# Patient Record
Sex: Female | Born: 1940
Health system: Southern US, Community
[De-identification: ages and names within clinical notes are randomized; demographics above are authoritative.]

## PROBLEM LIST (undated history)

## (undated) DIAGNOSIS — J189 Pneumonia, unspecified organism: Secondary | ICD-10-CM

## (undated) DIAGNOSIS — I700 Atherosclerosis of aorta: Secondary | ICD-10-CM

## (undated) DIAGNOSIS — Z2989 Encounter for other specified prophylactic measures: Secondary | ICD-10-CM

## (undated) DIAGNOSIS — C50912 Malignant neoplasm of unspecified site of left female breast: Secondary | ICD-10-CM

## (undated) DIAGNOSIS — M1990 Unspecified osteoarthritis, unspecified site: Secondary | ICD-10-CM

## (undated) DIAGNOSIS — I4821 Permanent atrial fibrillation: Secondary | ICD-10-CM

## (undated) DIAGNOSIS — Z298 Encounter for other specified prophylactic measures: Secondary | ICD-10-CM

## (undated) HISTORY — DX: Permanent atrial fibrillation: I48.21

## (undated) HISTORY — PX: APPENDECTOMY: SHX54

## (undated) HISTORY — PX: TUBAL LIGATION: SHX77

## (undated) HISTORY — DX: Atherosclerosis of aorta: I70.0

## (undated) HISTORY — DX: Encounter for other specified prophylactic measures: Z29.8

## (undated) HISTORY — DX: Encounter for other specified prophylactic measures: Z29.89

---

## 1985-10-25 DIAGNOSIS — C50912 Malignant neoplasm of unspecified site of left female breast: Secondary | ICD-10-CM

## 1985-10-25 HISTORY — PX: BREAST BIOPSY: SHX20

## 1985-10-25 HISTORY — PX: MASTECTOMY: SHX3

## 1985-10-25 HISTORY — DX: Malignant neoplasm of unspecified site of left female breast: C50.912

## 1998-05-07 ENCOUNTER — Ambulatory Visit (HOSPITAL_COMMUNITY): Admission: RE | Admit: 1998-05-07 | Discharge: 1998-05-07 | Payer: Self-pay | Admitting: General Surgery

## 2001-04-13 ENCOUNTER — Other Ambulatory Visit: Admission: RE | Admit: 2001-04-13 | Discharge: 2001-04-13 | Payer: Self-pay | Admitting: Obstetrics and Gynecology

## 2002-05-16 ENCOUNTER — Other Ambulatory Visit: Admission: RE | Admit: 2002-05-16 | Discharge: 2002-05-16 | Payer: Self-pay | Admitting: Obstetrics and Gynecology

## 2002-08-20 ENCOUNTER — Ambulatory Visit (HOSPITAL_COMMUNITY): Admission: RE | Admit: 2002-08-20 | Discharge: 2002-08-20 | Payer: Self-pay | Admitting: Gastroenterology

## 2002-08-20 ENCOUNTER — Encounter (INDEPENDENT_AMBULATORY_CARE_PROVIDER_SITE_OTHER): Payer: Self-pay

## 2003-05-09 ENCOUNTER — Encounter: Admission: RE | Admit: 2003-05-09 | Discharge: 2003-06-10 | Payer: Self-pay | Admitting: Family Medicine

## 2003-08-02 ENCOUNTER — Other Ambulatory Visit: Admission: RE | Admit: 2003-08-02 | Discharge: 2003-08-02 | Payer: Self-pay | Admitting: Obstetrics and Gynecology

## 2004-12-07 ENCOUNTER — Other Ambulatory Visit: Admission: RE | Admit: 2004-12-07 | Discharge: 2004-12-07 | Payer: Self-pay | Admitting: Obstetrics and Gynecology

## 2006-02-07 ENCOUNTER — Other Ambulatory Visit: Admission: RE | Admit: 2006-02-07 | Discharge: 2006-02-07 | Payer: Self-pay | Admitting: Obstetrics and Gynecology

## 2007-02-28 ENCOUNTER — Other Ambulatory Visit: Admission: RE | Admit: 2007-02-28 | Discharge: 2007-02-28 | Payer: Self-pay | Admitting: Obstetrics and Gynecology

## 2008-03-01 ENCOUNTER — Other Ambulatory Visit: Admission: RE | Admit: 2008-03-01 | Discharge: 2008-03-01 | Payer: Self-pay | Admitting: Family Medicine

## 2010-06-01 ENCOUNTER — Ambulatory Visit: Payer: Self-pay | Admitting: Cardiology

## 2010-06-18 ENCOUNTER — Ambulatory Visit: Payer: Self-pay | Admitting: Cardiology

## 2010-08-19 ENCOUNTER — Ambulatory Visit: Payer: Self-pay | Admitting: Cardiology

## 2010-09-03 ENCOUNTER — Encounter: Admission: RE | Admit: 2010-09-03 | Discharge: 2010-09-03 | Payer: Self-pay | Admitting: Family Medicine

## 2010-09-21 ENCOUNTER — Ambulatory Visit: Payer: Self-pay | Admitting: Cardiology

## 2010-10-25 HISTORY — PX: US ECHOCARDIOGRAPHY: HXRAD669

## 2010-10-28 ENCOUNTER — Encounter: Payer: Self-pay | Admitting: Cardiology

## 2010-10-29 ENCOUNTER — Other Ambulatory Visit: Payer: Self-pay | Admitting: Cardiology

## 2010-10-29 ENCOUNTER — Ambulatory Visit (HOSPITAL_COMMUNITY)
Admission: RE | Admit: 2010-10-29 | Discharge: 2010-10-29 | Payer: Self-pay | Source: Home / Self Care | Attending: Cardiology | Admitting: Cardiology

## 2010-10-29 ENCOUNTER — Ambulatory Visit: Admission: RE | Admit: 2010-10-29 | Discharge: 2010-10-29 | Payer: Self-pay | Source: Home / Self Care

## 2010-11-06 ENCOUNTER — Ambulatory Visit: Payer: Self-pay | Admitting: Cardiology

## 2011-01-04 ENCOUNTER — Encounter (INDEPENDENT_AMBULATORY_CARE_PROVIDER_SITE_OTHER): Payer: Medicare Other

## 2011-01-04 DIAGNOSIS — Z7901 Long term (current) use of anticoagulants: Secondary | ICD-10-CM

## 2011-01-04 DIAGNOSIS — I4891 Unspecified atrial fibrillation: Secondary | ICD-10-CM

## 2011-02-22 ENCOUNTER — Other Ambulatory Visit: Payer: Self-pay | Admitting: Cardiology

## 2011-02-22 DIAGNOSIS — Z7901 Long term (current) use of anticoagulants: Secondary | ICD-10-CM

## 2011-02-22 DIAGNOSIS — I4891 Unspecified atrial fibrillation: Secondary | ICD-10-CM

## 2011-03-08 ENCOUNTER — Encounter: Payer: Self-pay | Admitting: *Deleted

## 2011-03-09 ENCOUNTER — Ambulatory Visit (INDEPENDENT_AMBULATORY_CARE_PROVIDER_SITE_OTHER): Payer: Medicare Other | Admitting: *Deleted

## 2011-03-09 ENCOUNTER — Ambulatory Visit (INDEPENDENT_AMBULATORY_CARE_PROVIDER_SITE_OTHER): Payer: Medicare Other | Admitting: Cardiology

## 2011-03-09 ENCOUNTER — Encounter: Payer: Self-pay | Admitting: Cardiology

## 2011-03-09 VITALS — BP 126/78 | HR 78 | Ht 63.0 in | Wt 146.0 lb

## 2011-03-09 DIAGNOSIS — Z7901 Long term (current) use of anticoagulants: Secondary | ICD-10-CM | POA: Insufficient documentation

## 2011-03-09 DIAGNOSIS — I4891 Unspecified atrial fibrillation: Secondary | ICD-10-CM

## 2011-03-09 NOTE — Assessment & Plan Note (Signed)
The patient has a history of chronic atrial fibrillation.  She has been on long-term Coumadin without difficulty.  She has not been expressing any TIA or stroke symptoms.  She denies any chest pain or shortness of breath.  Her energy level is good.  Her only complaint today is difficulty with insomnia and she is going to try some over-the-counter melatonin to help that.

## 2011-03-09 NOTE — Progress Notes (Signed)
Kathryn Chang Date of Birth:  Oct 20, 1941 [facility] / [facility] [address]   [address] [place], [place]  [address] [phone]           Fax   [phone]  HPI: This 70 year old woman is seen for a scheduled followup office visit.  She's had a long history of atrial fibrillation.  Her last echocardiogram on 10/29/10 showed normal left ventricular systolic function with an ejection fraction of 55-60% and she had mild aortic insufficiency and mild mitral regurgitation.  She has not had any symptoms of congestive heart failure.  She's not having any chest pain and she's not having any awareness of palpitations.  Current Outpatient Prescriptions  Medication Sig Dispense Refill  . Alendronate Sodium (FOSAMAX PO) Take by mouth once a week.        Marland Kitchen aspirin 81 MG tablet Take 81 mg by mouth daily.        . Calcium Carbonate-Vitamin D (CALCIUM + D PO) Take 400 mg by mouth daily.        . Cholecalciferol (VITAMIN D PO) Take 2,000 Units by mouth daily.        . digoxin (LANOXIN) 0.25 MG tablet Take 250 mcg by mouth daily.        Tery Sanfilippo Calcium (STOOL SOFTENER PO) Take by mouth as needed.        . fish oil-omega-3 fatty acids 1000 MG capsule Take 1 g by mouth daily.        . Multiple Vitamin (MULTIVITAMIN PO) Take by mouth daily.        Marland Kitchen warfarin (COUMADIN) 5 MG tablet Take 5 mg by mouth daily.          Allergies  Allergen Reactions  . Shellfish-Derived Products     Patient Active Problem List  Diagnoses  . Atrial fibrillation  . Encounter for long-term (current) use of anticoagulants    History  Smoking status  . Never Smoker   Smokeless tobacco  . Not on file    History  Alcohol Use: Not on file    Family History  Problem Relation Age of Onset  . Hypertension Father   . Heart disease Father     Review of Systems: The patient denies any heat or cold intolerance.  No weight gain or weight loss.  The patient denies headaches or blurry vision.   There is no cough or sputum production.  The patient denies dizziness.  There is no hematuria or hematochezia.  The patient denies any muscle aches or arthritis.  The patient denies any rash.  The patient denies frequent falling or instability.  There is no history of depression or anxiety.  All other systems were reviewed and are negative.   Physical Exam: Filed Vitals:   03/09/11 1107  BP: 126/78  Pulse: 78  The general appearance reveals a well-developed well-nourished woman in no distress.Pupils equal and reactive.   Extraocular Movements are full.  There is no scleral icterus.  The mouth and pharynx are normal.  The neck is supple.  The carotids reveal no bruits.  The jugular venous pressure is normal.  The thyroid is not enlarged.  There is no lymphadenopathy.The chest is clear to percussion and auscultation. There are no rales or rhonchi. Expansion of the chest is symmetrical.The precordium is quiet.  The first heart sound is normal.  The second heart sound is physiologically split.  There is no murmur gallop rub or click.  There is no abnormal  lift or heave. The rhythm is irregular.The abdomen is soft and nontender. Bowel sounds are normal. The liver and spleen are not enlarged. There Are no abdominal masses. There are no bruits.  All extremities without phlebitis or edema.  Pedal pulses are good.Strength is normal and symmetrical in all extremities.  There is no lateralizing weakness.  There are no sensory deficits.The skin is warm and dry.  There is no rash.    Assessment / Plan: Her INR is satisfactory at 3.1.  She will return at monthly intervals for protime to we will see her in 4 months for followup office visit and EKG.

## 2011-03-12 NOTE — Op Note (Signed)
   NAME:  Kathryn Chang, Kathryn Chang                  ACCOUNT NO.:  [id number]   MEDICAL RECORD NO.:  [id number]                   PATIENT TYPE:  AMB   LOCATION:  ENDO                                 FACILITY:  [facility]   PHYSICIAN:  James Chang. Malon Kindle., M.D.          DATE OF BIRTH:  Dec 15, 1940   DATE OF PROCEDURE:  08/20/2002  DATE OF DISCHARGE:                                 OPERATIVE REPORT   PROCEDURE:  Colonoscopy and polypectomy.   MEDICATIONS:  Fentanyl 100 mcg, Versed 8 mg IV.   ENDOSCOPE:  Olympus pediatric video colonoscope.   INDICATIONS:  Strong family history of colon neoplasia.   DESCRIPTION OF PROCEDURE:  The procedure had been explained to the patient  and consent obtained.  With the patient in the left lateral decubitus  position, the scope was inserted and advanced.  We advanced to the cecum.  The ileocecal valve and appendiceal orifice were seen.  The scope was  withdrawn, and the cecum, ascending colon, hepatic flexure, transverse  colon, descending colon seen well.  At 50 cm from the anal verge, a 1 cm  polyp was encountered and was removed in two pieces and sucked through the  scope.  There was no bleeding.  The remainder of the descending colon and  sigmoid colon were free of polyps.  The scope was withdrawn.  The patient  tolerated the procedure well.  The rectum was free of polyps as well.   ASSESSMENT:  Descending colon polyp, snared and removed.   PLAN:  Routine postpolypectomy instructions.  Will recommend repeating in  three years.                                                 James Chang. Malon Kindle., M.D.    Waldron Session  D:  08/20/2002  T:  08/21/2002  Job:  [id number]   cc:   Caryn Bee Chang. Little, M.D.  [address]  [place]  [place] [address]  Fax: [phone]

## 2011-04-09 ENCOUNTER — Ambulatory Visit (INDEPENDENT_AMBULATORY_CARE_PROVIDER_SITE_OTHER): Payer: Medicare Other | Admitting: *Deleted

## 2011-04-09 DIAGNOSIS — I4891 Unspecified atrial fibrillation: Secondary | ICD-10-CM

## 2011-05-21 ENCOUNTER — Ambulatory Visit (INDEPENDENT_AMBULATORY_CARE_PROVIDER_SITE_OTHER): Payer: Medicare Other | Admitting: *Deleted

## 2011-05-21 DIAGNOSIS — I4891 Unspecified atrial fibrillation: Secondary | ICD-10-CM

## 2011-05-21 LAB — POCT INR: INR: 2.6

## 2011-07-13 ENCOUNTER — Telehealth: Payer: Self-pay | Admitting: Cardiology

## 2011-07-13 ENCOUNTER — Encounter (INDEPENDENT_AMBULATORY_CARE_PROVIDER_SITE_OTHER): Payer: Medicare Other | Admitting: *Deleted

## 2011-07-13 ENCOUNTER — Ambulatory Visit (INDEPENDENT_AMBULATORY_CARE_PROVIDER_SITE_OTHER): Payer: Medicare Other | Admitting: Cardiology

## 2011-07-13 VITALS — BP 132/72 | HR 74 | Wt 146.0 lb

## 2011-07-13 DIAGNOSIS — I4891 Unspecified atrial fibrillation: Secondary | ICD-10-CM

## 2011-07-13 NOTE — Assessment & Plan Note (Signed)
The patient is in chronic atrial fibrillation.  She has had no thromboembolic events.  She's not having any chest pain or shortness of breath or palpitations.  Her energy level is good.

## 2011-07-13 NOTE — Progress Notes (Signed)
Kathryn Chang Date of Birth:  19-Dec-1940 [name] / [facility] [address]   [address] [place], [place]  [address] [phone]           Fax   [phone]  HPI: This pleasant 70 year old woman is seen for a scheduled followup office visit.  She has a history of chronic atrial fibrillation.  Her last echocardiogram on 10/29/10 showed normal left ventricular systolic function with an ejection fraction of 55-60% with mild aortic insufficiency and mild mitral regurgitation.  The patient has not been expressing any new cardiac symptoms.  Current Outpatient Prescriptions  Medication Sig Dispense Refill  . Alendronate Sodium (FOSAMAX PO) Take by mouth once a week.        Marland Kitchen aspirin 81 MG tablet Take 81 mg by mouth daily.        . Calcium Carbonate-Vitamin D (CALCIUM + D PO) Take 400 mg by mouth daily.        . Cholecalciferol (VITAMIN D PO) Take 2,000 Units by mouth daily.        . digoxin (LANOXIN) 0.25 MG tablet Take 250 mcg by mouth daily.        Tery Sanfilippo Calcium (STOOL SOFTENER PO) Take by mouth as needed.        . fish oil-omega-3 fatty acids 1000 MG capsule Take 1 g by mouth daily.        . Multiple Vitamin (MULTIVITAMIN PO) Take by mouth daily.        Marland Kitchen warfarin (COUMADIN) 5 MG tablet Take 5 mg by mouth daily.          Allergies  Allergen Reactions  . Shellfish-Derived Products     Patient Active Problem List  Diagnoses  . Atrial fibrillation  . Encounter for long-term (current) use of anticoagulants    History  Smoking status  . Never Smoker   Smokeless tobacco  . Not on file    History  Alcohol Use: Not on file    Family History  Problem Relation Age of Onset  . Hypertension Father   . Heart disease Father     Review of Systems: The patient denies any heat or cold intolerance.  No weight gain or weight loss.  The patient denies headaches or blurry vision.  There is no cough or sputum production.  The patient denies dizziness.   There is no hematuria or hematochezia.  The patient denies any muscle aches or arthritis.  The patient denies any rash.  The patient denies frequent falling or instability.  There is no history of depression or anxiety.  All other systems were reviewed and are negative.   Physical Exam: Filed Vitals:   07/13/11 1754  BP: 132/72  Pulse: 74   The general appearance reveals a well-developed well-nourished woman in no distress.Pupils equal and reactive.   Extraocular Movements are full.  There is no scleral icterus.  The mouth and pharynx are normal.  The neck is supple.  The carotids reveal no bruits.  The jugular venous pressure is normal.  The thyroid is not enlarged.  There is no lymphadenopathy.  The chest is clear to percussion and auscultation. There are no rales or rhonchi. Expansion of the chest is symmetrical.    The heart reveals a soft systolic murmur at the base.  No diastolic murmur.  No gallop or rub. The abdomen is soft and nontender. Bowel sounds are normal. The liver and spleen are not enlarged. There Are no abdominal masses. There  are no bruits.  The pedal pulses are good.  There is no phlebitis or edema.  There is no cyanosis or clubbing.  Strength is normal and symmetrical in all extremities.  There is no lateralizing weakness.  There are no sensory deficits.  The skin is warm and dry.  There is no rash.    Assessment / Plan: Her electrocardiogram today shows atrial fibrillation with a controlled ventricular response.  He will continue same medication and return in one month for protime to the Coumadin clinic.  Recheck in 4 months for a followup office visit

## 2011-07-14 ENCOUNTER — Encounter: Payer: Self-pay | Admitting: Cardiology

## 2011-08-16 ENCOUNTER — Encounter: Payer: Medicare Other | Admitting: *Deleted

## 2011-08-18 ENCOUNTER — Ambulatory Visit (INDEPENDENT_AMBULATORY_CARE_PROVIDER_SITE_OTHER): Payer: Medicare Other | Admitting: *Deleted

## 2011-08-18 DIAGNOSIS — I4891 Unspecified atrial fibrillation: Secondary | ICD-10-CM

## 2011-08-18 DIAGNOSIS — Z7901 Long term (current) use of anticoagulants: Secondary | ICD-10-CM

## 2011-08-18 LAB — POCT INR: INR: 2.2

## 2011-09-14 ENCOUNTER — Ambulatory Visit (INDEPENDENT_AMBULATORY_CARE_PROVIDER_SITE_OTHER): Payer: Medicare Other | Admitting: *Deleted

## 2011-09-14 DIAGNOSIS — Z7901 Long term (current) use of anticoagulants: Secondary | ICD-10-CM

## 2011-09-14 DIAGNOSIS — I4891 Unspecified atrial fibrillation: Secondary | ICD-10-CM

## 2011-09-14 LAB — POCT INR: INR: 3.5

## 2011-09-15 ENCOUNTER — Encounter: Payer: Medicare Other | Admitting: *Deleted

## 2011-09-29 ENCOUNTER — Ambulatory Visit (INDEPENDENT_AMBULATORY_CARE_PROVIDER_SITE_OTHER): Payer: Medicare Other | Admitting: *Deleted

## 2011-09-29 DIAGNOSIS — Z7901 Long term (current) use of anticoagulants: Secondary | ICD-10-CM

## 2011-09-29 DIAGNOSIS — I4891 Unspecified atrial fibrillation: Secondary | ICD-10-CM

## 2011-10-08 ENCOUNTER — Other Ambulatory Visit: Payer: Self-pay | Admitting: Cardiology

## 2011-10-27 ENCOUNTER — Ambulatory Visit (INDEPENDENT_AMBULATORY_CARE_PROVIDER_SITE_OTHER): Payer: Medicare Other | Admitting: *Deleted

## 2011-10-27 DIAGNOSIS — Z7901 Long term (current) use of anticoagulants: Secondary | ICD-10-CM

## 2011-10-27 DIAGNOSIS — I4891 Unspecified atrial fibrillation: Secondary | ICD-10-CM

## 2011-11-24 ENCOUNTER — Encounter: Payer: Medicare Other | Admitting: *Deleted

## 2011-11-24 ENCOUNTER — Ambulatory Visit: Payer: Medicare Other | Admitting: Cardiology

## 2011-11-24 ENCOUNTER — Ambulatory Visit: Payer: Medicare Other | Admitting: Nurse Practitioner

## 2011-12-01 ENCOUNTER — Ambulatory Visit (INDEPENDENT_AMBULATORY_CARE_PROVIDER_SITE_OTHER): Payer: Medicare Other | Admitting: *Deleted

## 2011-12-01 DIAGNOSIS — I4891 Unspecified atrial fibrillation: Secondary | ICD-10-CM

## 2011-12-01 DIAGNOSIS — Z7901 Long term (current) use of anticoagulants: Secondary | ICD-10-CM

## 2011-12-01 LAB — POCT INR: INR: 2.9

## 2011-12-15 ENCOUNTER — Encounter: Payer: Self-pay | Admitting: Cardiology

## 2011-12-15 ENCOUNTER — Ambulatory Visit (INDEPENDENT_AMBULATORY_CARE_PROVIDER_SITE_OTHER): Payer: Medicare Other | Admitting: Cardiology

## 2011-12-15 VITALS — BP 124/76 | HR 98 | Ht 63.0 in | Wt 158.0 lb

## 2011-12-15 DIAGNOSIS — I4891 Unspecified atrial fibrillation: Secondary | ICD-10-CM

## 2011-12-15 NOTE — Assessment & Plan Note (Signed)
The patient has had no symptoms referable to her atrial fibrillation.  No TIA symptoms.  No problems with her Coumadin.

## 2011-12-15 NOTE — Patient Instructions (Signed)
Your physician recommends that you continue on your current medications as directed. Please refer to the Current Medication list given to you today. Your physician recommends that you schedule a follow-up appointment in: 4 month 

## 2011-12-15 NOTE — Progress Notes (Signed)
Randon Goldsmith Hausmann Date of Birth:  1941/03/09 [facility] [address] [place], [place]  [address] [phone]  Fax   [phone]  HPI: This pleasant 71 year old woman is seen for a four-month followup office visit.  She has a history of established chronic atrial fibrillation.  He is on long-term Coumadin.  She has not been experiencing any chest pain or shortness of breath since last visit her weight has gone up 12 pounds.  She was under a lot of stress because her mother who lived in [place] had open-heart surgery and subsequently died after a month in the ICU.  Current Outpatient Prescriptions  Medication Sig Dispense Refill  . Alendronate Sodium (FOSAMAX PO) Take by mouth once a week.        Marland Kitchen aspirin 81 MG tablet Take 81 mg by mouth daily.        . Calcium Carbonate-Vitamin D (CALCIUM + D PO) Take 400 mg by mouth daily.        . Cholecalciferol (VITAMIN D PO) Take 2,000 Units by mouth daily.        Tery Sanfilippo Calcium (STOOL SOFTENER PO) Take by mouth as needed.        . fish oil-omega-3 fatty acids 1000 MG capsule Take 1 g by mouth daily.        Marland Kitchen LANOXIN 0.25 MG tablet Take 1 tablet (250 mcg total) by mouth daily.  90 tablet  2  . Multiple Vitamin (MULTIVITAMIN PO) Take by mouth daily.        Marland Kitchen warfarin (COUMADIN) 5 MG tablet Take 5 mg by mouth daily.          Allergies  Allergen Reactions  . Shellfish-Derived Products     Patient Active Problem List  Diagnoses  . Atrial fibrillation  . Encounter for long-term (current) use of anticoagulants    History  Smoking status  . Never Smoker   Smokeless tobacco  . Not on file    History  Alcohol Use: Not on file    Family History  Problem Relation Age of Onset  . Hypertension Father   . Heart disease Father     Review of Systems: The patient denies any heat or cold intolerance.  No weight gain or weight loss.  The patient denies headaches or blurry vision.  There is no cough or sputum  production.  The patient denies dizziness.  There is no hematuria or hematochezia.  The patient denies any muscle aches or arthritis.  The patient denies any rash.  The patient denies frequent falling or instability.  There is no history of depression or anxiety.  All other systems were reviewed and are negative.   Physical Exam: Filed Vitals:   12/15/11 1514  BP: 124/76  Pulse: 98   the general appearance reveals a well-developed well-nourished woman in no distress.Pupils equal and reactive.   Extraocular Movements are full.  There is no scleral icterus.  The mouth and pharynx are normal.  The neck is supple.  The carotids reveal no bruits.  The jugular venous pressure is normal.  The thyroid is not enlarged.  There is no lymphadenopathy.  The chest is clear to percussion and auscultation. There are no rales or rhonchi. Expansion of the chest is symmetrical.  Heart reveals an irregular rhythm.  No murmur gallop rub or click.The abdomen is soft and nontender. Bowel sounds are normal. The liver and spleen are not enlarged. There Are no abdominal masses. There are no bruits.  The pedal pulses are good.  There is no phlebitis or edema.  There is no cyanosis or clubbing. Strength is normal and symmetrical in all extremities.  There is no lateralizing weakness.  There are no sensory deficits.  The skin is warm and dry.  There is no rash.     Assessment / Plan: The patient is to continue same medication.  She will work hard to get the extra weight off before next visit.

## 2012-01-12 ENCOUNTER — Ambulatory Visit (INDEPENDENT_AMBULATORY_CARE_PROVIDER_SITE_OTHER): Payer: Medicare Other | Admitting: *Deleted

## 2012-01-12 DIAGNOSIS — I4891 Unspecified atrial fibrillation: Secondary | ICD-10-CM

## 2012-01-12 DIAGNOSIS — Z7901 Long term (current) use of anticoagulants: Secondary | ICD-10-CM

## 2012-01-12 LAB — POCT INR: INR: 3.6

## 2012-02-02 ENCOUNTER — Ambulatory Visit (INDEPENDENT_AMBULATORY_CARE_PROVIDER_SITE_OTHER): Payer: Medicare Other | Admitting: *Deleted

## 2012-02-02 DIAGNOSIS — I4891 Unspecified atrial fibrillation: Secondary | ICD-10-CM

## 2012-02-02 DIAGNOSIS — Z7901 Long term (current) use of anticoagulants: Secondary | ICD-10-CM

## 2012-02-08 NOTE — Telephone Encounter (Signed)
ENCOUNTER COMPLETED 

## 2012-02-09 ENCOUNTER — Telehealth: Payer: Self-pay | Admitting: Cardiology

## 2012-03-06 ENCOUNTER — Ambulatory Visit (INDEPENDENT_AMBULATORY_CARE_PROVIDER_SITE_OTHER): Payer: Medicare Other | Admitting: *Deleted

## 2012-03-06 DIAGNOSIS — Z7901 Long term (current) use of anticoagulants: Secondary | ICD-10-CM

## 2012-03-06 DIAGNOSIS — I4891 Unspecified atrial fibrillation: Secondary | ICD-10-CM

## 2012-03-06 MED ORDER — WARFARIN SODIUM 5 MG PO TABS: 5.0000 mg | ORAL_TABLET | Freq: Every day | ORAL | Status: DC

## 2012-03-16 ENCOUNTER — Telehealth: Payer: Self-pay | Admitting: Cardiology

## 2012-03-16 NOTE — Telephone Encounter (Signed)
New msg Pt is having colonoscopy and they want to know can he hold Plavix. Please fax ok to [phone]

## 2012-03-16 NOTE — Telephone Encounter (Signed)
Marchelle Folks from Dr. Randa Evens' office wold like to know if patient can hold Plavix medication prior Colonoscopy. Patient is scheduled for this procedure June 4 th 2013. Please fax clearance to [phone].

## 2012-03-20 NOTE — Telephone Encounter (Signed)
She is on warfarin, not plavix.  OK to stop warfarin 5 days before colonoscopy. Does not require bridging.

## 2012-03-21 NOTE — Telephone Encounter (Signed)
Advised patient and faxed to [facility]

## 2012-04-03 ENCOUNTER — Ambulatory Visit (INDEPENDENT_AMBULATORY_CARE_PROVIDER_SITE_OTHER): Payer: Medicare Other | Admitting: *Deleted

## 2012-04-03 DIAGNOSIS — Z7901 Long term (current) use of anticoagulants: Secondary | ICD-10-CM

## 2012-04-03 DIAGNOSIS — I4891 Unspecified atrial fibrillation: Secondary | ICD-10-CM

## 2012-04-03 LAB — POCT INR: INR: 1.5

## 2012-04-11 ENCOUNTER — Encounter: Payer: Self-pay | Admitting: Cardiology

## 2012-04-11 ENCOUNTER — Ambulatory Visit (INDEPENDENT_AMBULATORY_CARE_PROVIDER_SITE_OTHER): Payer: Medicare Other | Admitting: Cardiology

## 2012-04-11 ENCOUNTER — Ambulatory Visit (INDEPENDENT_AMBULATORY_CARE_PROVIDER_SITE_OTHER): Payer: Medicare Other | Admitting: Pharmacist

## 2012-04-11 VITALS — BP 126/81 | HR 68 | Ht 63.0 in | Wt 155.8 lb

## 2012-04-11 DIAGNOSIS — I4891 Unspecified atrial fibrillation: Secondary | ICD-10-CM

## 2012-04-11 DIAGNOSIS — Z7901 Long term (current) use of anticoagulants: Secondary | ICD-10-CM

## 2012-04-11 NOTE — Progress Notes (Signed)
Kathryn Chang Date of Birth:  04-16-1941 [facility] [address] [place], [place]  [address] [phone]  Fax   [phone]  HPI: This pleasant 71 year old woman is seen for a scheduled followup office visit.  She has a history of established atrial fibrillation.  She is on long-term Coumadin.  He has been feeling well since last visit.  Several months ago she had bronchitis which lasted about a month.  She had been on 2 rounds of antibiotics before it got better.  Recently she had an uneventful colonoscopy.  She does not need another one for 5 years.  The patient is attempting to get more regular exercise.  She has joined [employer] and is walking and doing yoga.  In August she will be traveling to [place] on a mission trip with her church.  Current Outpatient Prescriptions  Medication Sig Dispense Refill  . Alendronate Sodium (FOSAMAX PO) Take by mouth once a week.        Marland Kitchen aspirin 81 MG tablet Take 81 mg by mouth daily.        . Calcium Carbonate-Vitamin D (CALCIUM + D PO) Take 400 mg by mouth daily.        . Cholecalciferol (VITAMIN D PO) Take 2,000 Units by mouth daily.        Tery Sanfilippo Calcium (STOOL SOFTENER PO) Take by mouth as needed.        . fish oil-omega-3 fatty acids 1000 MG capsule Take 1 g by mouth daily.        Marland Kitchen LANOXIN 0.25 MG tablet Take 1 tablet (250 mcg total) by mouth daily.  90 tablet  2  . Multiple Vitamin (MULTIVITAMIN PO) Take by mouth daily.        Marland Kitchen warfarin (COUMADIN) 5 MG tablet Take 1 tablet (5 mg total) by mouth daily. This is a 90 day supply, take as directed by coumadin clinic  105 tablet  1    Allergies  Allergen Reactions  . Shellfish-Derived Products     Patient Active Problem List  Diagnosis  . Atrial fibrillation  . Encounter for long-term (current) use of anticoagulants    History  Smoking status  . Never Smoker   Smokeless tobacco  . Not on file    History  Alcohol Use: Not on file    Family  History  Problem Relation Age of Onset  . Hypertension Father   . Heart disease Father     Review of Systems: The patient denies any heat or cold intolerance.  No weight gain or weight loss.  The patient denies headaches or blurry vision.  There is no cough or sputum production.  The patient denies dizziness.  There is no hematuria or hematochezia.  The patient denies any muscle aches or arthritis.  The patient denies any rash.  The patient denies frequent falling or instability.  There is no history of depression or anxiety.  All other systems were reviewed and are negative.   Physical Exam: Filed Vitals:   04/11/12 0913  BP: 126/81  Pulse: 68   general appearance reveals a well-developed well-nourished middle-age woman in no distress.The head and neck exam reveals pupils equal and reactive.  Extraocular movements are full.  There is no scleral icterus.  The mouth and pharynx are normal.  The neck is supple.  The carotids reveal no bruits.  The jugular venous pressure is normal.  The  thyroid is not enlarged.  There is no lymphadenopathy.  The  chest is clear to percussion and auscultation.  There are no rales or rhonchi.  Expansion of the chest is symmetrical.  The precordium is quiet.  The rhythm is irregular  The first heart sound is normal.  The second heart sound is physiologically split.  There is no murmur gallop rub or click.  There is no abnormal lift or heave.  The abdomen is soft and nontender.  The bowel sounds are normal.  The liver and spleen are not enlarged.  There are no abdominal masses.  There are no abdominal bruits.  Extremities reveal good pedal pulses.  There is no phlebitis or edema.  There is no cyanosis or clubbing.  Strength is normal and symmetrical in all extremities.  There is no lateralizing weakness.  There are no sensory deficits.  The skin is warm and dry.  There is no rash.      Assessment / Plan:  Continue same medication.  Recheck in 4 months for followup  office visit.

## 2012-04-11 NOTE — Assessment & Plan Note (Signed)
The patient is not having any problems of bruising or bleeding from her Coumadin anticoagulation

## 2012-04-11 NOTE — Assessment & Plan Note (Signed)
The patient has not been experiencing any TIA symptoms.  No chest pain or shortness of breath.

## 2012-04-11 NOTE — Patient Instructions (Addendum)
Your physician recommends that you schedule a follow-up appointment in: 4 months with Dr. Patty Sermons.

## 2012-05-02 ENCOUNTER — Ambulatory Visit (INDEPENDENT_AMBULATORY_CARE_PROVIDER_SITE_OTHER): Payer: Medicare Other | Admitting: Pharmacist

## 2012-05-02 DIAGNOSIS — I4891 Unspecified atrial fibrillation: Secondary | ICD-10-CM

## 2012-05-02 DIAGNOSIS — Z7901 Long term (current) use of anticoagulants: Secondary | ICD-10-CM

## 2012-05-02 LAB — POCT INR: INR: 5.5

## 2012-05-10 ENCOUNTER — Ambulatory Visit (INDEPENDENT_AMBULATORY_CARE_PROVIDER_SITE_OTHER): Payer: Medicare Other | Admitting: *Deleted

## 2012-05-10 DIAGNOSIS — I4891 Unspecified atrial fibrillation: Secondary | ICD-10-CM

## 2012-05-10 DIAGNOSIS — Z7901 Long term (current) use of anticoagulants: Secondary | ICD-10-CM

## 2012-05-30 ENCOUNTER — Ambulatory Visit (INDEPENDENT_AMBULATORY_CARE_PROVIDER_SITE_OTHER): Payer: Medicare Other

## 2012-05-30 DIAGNOSIS — I4891 Unspecified atrial fibrillation: Secondary | ICD-10-CM

## 2012-05-30 DIAGNOSIS — Z7901 Long term (current) use of anticoagulants: Secondary | ICD-10-CM

## 2012-06-12 ENCOUNTER — Ambulatory Visit (INDEPENDENT_AMBULATORY_CARE_PROVIDER_SITE_OTHER): Payer: Medicare Other | Admitting: *Deleted

## 2012-06-12 DIAGNOSIS — I4891 Unspecified atrial fibrillation: Secondary | ICD-10-CM

## 2012-06-12 DIAGNOSIS — Z7901 Long term (current) use of anticoagulants: Secondary | ICD-10-CM

## 2012-07-03 ENCOUNTER — Ambulatory Visit (INDEPENDENT_AMBULATORY_CARE_PROVIDER_SITE_OTHER): Payer: Medicare Other

## 2012-07-03 DIAGNOSIS — I4891 Unspecified atrial fibrillation: Secondary | ICD-10-CM

## 2012-07-03 DIAGNOSIS — Z7901 Long term (current) use of anticoagulants: Secondary | ICD-10-CM

## 2012-07-03 LAB — POCT INR: INR: 3.3

## 2012-07-26 ENCOUNTER — Ambulatory Visit (INDEPENDENT_AMBULATORY_CARE_PROVIDER_SITE_OTHER): Payer: Medicare Other | Admitting: Pharmacist

## 2012-07-26 DIAGNOSIS — I4891 Unspecified atrial fibrillation: Secondary | ICD-10-CM

## 2012-07-26 DIAGNOSIS — Z7901 Long term (current) use of anticoagulants: Secondary | ICD-10-CM

## 2012-07-26 LAB — POCT INR: INR: 2.3

## 2012-08-22 ENCOUNTER — Ambulatory Visit (INDEPENDENT_AMBULATORY_CARE_PROVIDER_SITE_OTHER): Payer: Medicare Other | Admitting: Cardiology

## 2012-08-22 ENCOUNTER — Encounter: Payer: Self-pay | Admitting: Cardiology

## 2012-08-22 ENCOUNTER — Ambulatory Visit (INDEPENDENT_AMBULATORY_CARE_PROVIDER_SITE_OTHER): Payer: Medicare Other | Admitting: *Deleted

## 2012-08-22 VITALS — BP 130/70 | HR 72 | Ht 63.0 in | Wt 155.8 lb

## 2012-08-22 DIAGNOSIS — I4891 Unspecified atrial fibrillation: Secondary | ICD-10-CM

## 2012-08-22 DIAGNOSIS — Z7901 Long term (current) use of anticoagulants: Secondary | ICD-10-CM

## 2012-08-22 NOTE — Assessment & Plan Note (Signed)
The patient remains in chronic atrial fibrillation.  She has not been experiencing any chest pain or shortness of breath.  She participates in exercise classes and in yoga.  She is disappointed that she has not been able to lose any weight.  She intends to go on a low carbohydrate diet.

## 2012-08-22 NOTE — Patient Instructions (Addendum)
Your physician recommends that you schedule a follow-up appointment in: 4 months.  Your physician recommends that you continue on your current medications as directed. Please refer to the Current Medication list given to you today.  

## 2012-08-22 NOTE — Progress Notes (Signed)
Kathryn Chang Date of Birth:  21-Jan-1941 [facility] [address] [place], [place]  [address] [phone]  Fax   [phone]  HPI: This pleasant 71 year old woman is seen for a scheduled followup office visit. She has a history of established atrial fibrillation. She is on long-term Coumadin.  She has been feeling well since last visit.  Since we last saw her she went on a mission trip to [place].  She stayed healthy during the trip and had no difficulty.   Current Outpatient Prescriptions  Medication Sig Dispense Refill  . Alendronate Sodium (FOSAMAX PO) Take by mouth once a week.        Marland Kitchen aspirin 81 MG tablet Take 81 mg by mouth daily.        . Calcium Carbonate-Vitamin D (CALCIUM + D PO) Take 400 mg by mouth daily.        . Cholecalciferol (VITAMIN D PO) Take 2,000 Units by mouth daily.        Tery Sanfilippo Calcium (STOOL SOFTENER PO) Take by mouth as needed.        . fish oil-omega-3 fatty acids 1000 MG capsule Take 1 g by mouth daily.        Marland Kitchen LANOXIN 0.25 MG tablet Take 1 tablet (250 mcg total) by mouth daily.  90 tablet  2  . Multiple Vitamin (MULTIVITAMIN PO) Take by mouth daily.        Marland Kitchen warfarin (COUMADIN) 5 MG tablet Take 1 tablet (5 mg total) by mouth daily. This is a 90 day supply, take as directed by coumadin clinic  105 tablet  1    Allergies  Allergen Reactions  . Shellfish-Derived Products     Patient Active Problem List  Diagnosis  . Atrial fibrillation  . Encounter for long-term (current) use of anticoagulants    History  Smoking status  . Never Smoker   Smokeless tobacco  . Not on file    History  Alcohol Use: Not on file    Family History  Problem Relation Age of Onset  . Hypertension Father   . Heart disease Father     Review of Systems: The patient denies any heat or cold intolerance.  No weight gain or weight loss.  The patient denies headaches or blurry vision.  There is no cough or sputum production.  The  patient denies dizziness.  There is no hematuria or hematochezia.  The patient denies any muscle aches or arthritis.  The patient denies any rash.  The patient denies frequent falling or instability.  There is no history of depression or anxiety.  All other systems were reviewed and are negative.   Physical Exam: Filed Vitals:   08/22/12 1407  BP: 130/70  Pulse: 72   on physical examination this is a well-developed well-nourished woman in no distress.The head and neck exam reveals pupils equal and reactive.  Extraocular movements are full.  There is no scleral icterus.  The mouth and pharynx are normal.  The neck is supple.  The carotids reveal no bruits.  The jugular venous pressure is normal.  The  thyroid is not enlarged.  There is no lymphadenopathy.  The chest is clear to percussion and auscultation.  There are no rales or rhonchi.  Expansion of the chest is symmetrical.  The precordium is quiet.  The pulse is irregular in atrial fibrillation.  The first heart sound is normal.  The second heart sound is physiologically split.  There is no murmur  gallop rub or click.  There is no abnormal lift or heave.  The abdomen is soft and nontender.  The bowel sounds are normal.  The liver and spleen are not enlarged.  There are no abdominal masses.  There are no abdominal bruits.  Extremities reveal good pedal pulses.  There is no phlebitis or edema.  There is no cyanosis or clubbing.  Strength is normal and symmetrical in all extremities.  There is no lateralizing weakness.  There are no sensory deficits.  The skin is warm and dry.  There is no rash.   EKG today shows atrial fibrillation with nonspecific T-wave flattening and no ischemic changes.  The ventricular rate is stable at 72  Assessment / Plan: The patient is doing well.  She will continue on current medication.  Recheck in 4 months for followup office visit.  Continue Coumadin as per Coumadin clinic.

## 2012-09-11 ENCOUNTER — Telehealth: Payer: Self-pay | Admitting: Cardiology

## 2012-09-11 NOTE — Telephone Encounter (Signed)
plz return call to pt [phone] to discuss possible medication alternatives for Lanoxin 2.5 mg. This medication has become very expensive for pt.

## 2012-09-11 NOTE — Telephone Encounter (Signed)
Patient is going to change to generic Lanoxin.  She just got 90 days from mail order. She will call back when she need refilled and will send over generic

## 2012-09-11 NOTE — Telephone Encounter (Signed)
No answer, will try again later.

## 2012-09-13 ENCOUNTER — Ambulatory Visit (INDEPENDENT_AMBULATORY_CARE_PROVIDER_SITE_OTHER): Payer: Medicare Other | Admitting: *Deleted

## 2012-09-13 DIAGNOSIS — I4891 Unspecified atrial fibrillation: Secondary | ICD-10-CM

## 2012-09-13 DIAGNOSIS — Z7901 Long term (current) use of anticoagulants: Secondary | ICD-10-CM

## 2012-09-13 LAB — POCT INR: INR: 2.4

## 2012-10-11 ENCOUNTER — Ambulatory Visit (INDEPENDENT_AMBULATORY_CARE_PROVIDER_SITE_OTHER): Payer: Medicare Other | Admitting: *Deleted

## 2012-10-11 DIAGNOSIS — Z7901 Long term (current) use of anticoagulants: Secondary | ICD-10-CM

## 2012-10-11 DIAGNOSIS — I4891 Unspecified atrial fibrillation: Secondary | ICD-10-CM

## 2012-10-11 LAB — POCT INR: INR: 1.4

## 2012-10-23 ENCOUNTER — Ambulatory Visit (INDEPENDENT_AMBULATORY_CARE_PROVIDER_SITE_OTHER): Payer: Medicare Other | Admitting: *Deleted

## 2012-10-23 DIAGNOSIS — Z7901 Long term (current) use of anticoagulants: Secondary | ICD-10-CM

## 2012-10-23 DIAGNOSIS — I4891 Unspecified atrial fibrillation: Secondary | ICD-10-CM

## 2012-11-03 ENCOUNTER — Ambulatory Visit (INDEPENDENT_AMBULATORY_CARE_PROVIDER_SITE_OTHER): Payer: Medicare Other | Admitting: *Deleted

## 2012-11-03 DIAGNOSIS — Z7901 Long term (current) use of anticoagulants: Secondary | ICD-10-CM

## 2012-11-03 DIAGNOSIS — I4891 Unspecified atrial fibrillation: Secondary | ICD-10-CM

## 2012-11-24 ENCOUNTER — Ambulatory Visit (INDEPENDENT_AMBULATORY_CARE_PROVIDER_SITE_OTHER): Payer: Medicare Other

## 2012-11-24 DIAGNOSIS — Z7901 Long term (current) use of anticoagulants: Secondary | ICD-10-CM

## 2012-11-24 DIAGNOSIS — I4891 Unspecified atrial fibrillation: Secondary | ICD-10-CM

## 2012-11-24 LAB — POCT INR: INR: 1.9

## 2012-12-19 ENCOUNTER — Ambulatory Visit (INDEPENDENT_AMBULATORY_CARE_PROVIDER_SITE_OTHER): Payer: Medicare Other | Admitting: *Deleted

## 2012-12-19 DIAGNOSIS — I4891 Unspecified atrial fibrillation: Secondary | ICD-10-CM

## 2012-12-19 DIAGNOSIS — Z7901 Long term (current) use of anticoagulants: Secondary | ICD-10-CM

## 2012-12-20 ENCOUNTER — Telehealth: Payer: Self-pay | Admitting: Cardiology

## 2012-12-20 MED ORDER — DIGOXIN 250 MCG PO TABS: 250.0000 ug | ORAL_TABLET | Freq: Every day | ORAL | Status: DC

## 2012-12-20 MED ORDER — WARFARIN SODIUM 5 MG PO TABS: 5.0000 mg | ORAL_TABLET | Freq: Every day | ORAL | Status: DC

## 2012-12-20 NOTE — Telephone Encounter (Signed)
New Prob    Requesting generic for lanoxin.

## 2012-12-20 NOTE — Telephone Encounter (Signed)
It looks like she has been on 0.25 mg daily. Stay on the same dose except now it is generic.

## 2012-12-20 NOTE — Telephone Encounter (Signed)
Patient called wanting to change to generic Lanoxin 0.25 mg daily and needs refill on Warfarin.  Will forward to  Dr. Patty Sermons to make sure ok to change.

## 2013-01-01 ENCOUNTER — Telehealth: Payer: Self-pay | Admitting: *Deleted

## 2013-01-01 NOTE — Telephone Encounter (Signed)
Patient phoned stating that they sent her name brand Lanoxin instead of generic. Spoke with Mardelle Matte pharmacist at [facility] and they did receive Rx ok to give generic. He advised for patient to call back and they would get it worked out with sending her generic next time and even this time if she wanted to send back Rx. Advised patient

## 2013-01-03 ENCOUNTER — Ambulatory Visit: Payer: Medicare Other | Admitting: Cardiology

## 2013-01-08 ENCOUNTER — Ambulatory Visit: Payer: Medicare Other | Admitting: Cardiology

## 2013-01-22 ENCOUNTER — Encounter: Payer: Self-pay | Admitting: Cardiology

## 2013-01-22 ENCOUNTER — Ambulatory Visit (INDEPENDENT_AMBULATORY_CARE_PROVIDER_SITE_OTHER): Payer: Medicare Other | Admitting: *Deleted

## 2013-01-22 ENCOUNTER — Ambulatory Visit (INDEPENDENT_AMBULATORY_CARE_PROVIDER_SITE_OTHER): Payer: Medicare Other | Admitting: Cardiology

## 2013-01-22 VITALS — BP 124/74 | HR 88 | Ht 63.0 in | Wt 160.0 lb

## 2013-01-22 DIAGNOSIS — Z7901 Long term (current) use of anticoagulants: Secondary | ICD-10-CM

## 2013-01-22 DIAGNOSIS — I4891 Unspecified atrial fibrillation: Secondary | ICD-10-CM

## 2013-01-22 DIAGNOSIS — I059 Rheumatic mitral valve disease, unspecified: Secondary | ICD-10-CM

## 2013-01-22 DIAGNOSIS — I340 Nonrheumatic mitral (valve) insufficiency: Secondary | ICD-10-CM

## 2013-01-22 LAB — POCT INR: INR: 2.6

## 2013-01-22 NOTE — Patient Instructions (Addendum)
Your physician recommends that you continue on your current medications as directed. Please refer to the Current Medication list given to you today. Your physician wants you to follow-up in: 4 month You will receive a reminder letter in the mail two months in advance. If you don't receive a letter, please call our office to schedule the follow-up appointment.  

## 2013-01-22 NOTE — Assessment & Plan Note (Signed)
The patient has mild mitral regurgitation and mild aortic insufficiency.  He has not been expressing any exertional dyspnea.  She's had no orthopnea or paroxysmal nocturnal dyspnea.  No peripheral edema.  No angina pectoris.

## 2013-01-22 NOTE — Progress Notes (Signed)
Kathryn Chang Date of Birth:  1941-07-10 [facility] [address] [place], [place]  [address] [phone]  Fax   [phone]  HPI: This pleasant 72 year old woman is seen for a scheduled followup office visit. She has a history of established atrial fibrillation. She is on long-term Coumadin. She has been feeling well since last visit.  Her last echocardiogram was 10/29/10 and demonstrated - Left ventricle: The cavity size was normal. Wall thickness was normal. Systolic function was normal. The estimated ejection fraction was in the range of 55% to 60%. - Aortic valve: Mild regurgitation. - Mitral valve: Mild regurgitation. - Right atrium: The atrium was mildly dilated. - Pulmonary arteries: PA peak pressure: 35mm Hg (S).   Current Outpatient Prescriptions  Medication Sig Dispense Refill  . Alendronate Sodium (FOSAMAX PO) Take by mouth once a week.        Marland Kitchen aspirin 81 MG tablet Take 81 mg by mouth daily.        . Calcium Carbonate-Vitamin D (CALCIUM + D PO) Take 400 mg by mouth daily.        . Cholecalciferol (VITAMIN D PO) Take 2,000 Units by mouth daily.        . diclofenac sodium (VOLTAREN) 1 % GEL Apply topically daily as needed.      . digoxin (LANOXIN) 0.25 MG tablet Take 1 tablet (250 mcg total) by mouth daily.  90 tablet  3  . Docusate Calcium (STOOL SOFTENER PO) Take by mouth as needed.        . fish oil-omega-3 fatty acids 1000 MG capsule Take 1 g by mouth daily.        . Multiple Vitamin (MULTIVITAMIN PO) Take by mouth daily.        Marland Kitchen warfarin (COUMADIN) 5 MG tablet Take 1 tablet (5 mg total) by mouth daily. This is a 90 day supply, take as directed by coumadin clinic  105 tablet  1   No current facility-administered medications for this visit.    Allergies  Allergen Reactions  . Shellfish-Derived Products     Patient Active Problem List  Diagnosis  . Atrial fibrillation  . Encounter for long-term (current) use of anticoagulants    . Mitral regurgitation    History  Smoking status  . Never Smoker   Smokeless tobacco  . Not on file    History  Alcohol Use: Not on file    Family History  Problem Relation Age of Onset  . Hypertension Father   . Heart disease Father     Review of Systems: The patient denies any heat or cold intolerance.  No weight gain or weight loss.  The patient denies headaches or blurry vision.  There is no cough or sputum production.  The patient denies dizziness.  There is no hematuria or hematochezia.  The patient denies any muscle aches or arthritis.  The patient denies any rash.  The patient denies frequent falling or instability.  There is no history of depression or anxiety.  All other systems were reviewed and are negative.   Physical Exam: Filed Vitals:   01/22/13 1435  BP: 124/74  Pulse: 88   the general appearance reveals a well-developed well-nourished woman in no distress.The head and neck exam reveals pupils equal and reactive.  Extraocular movements are full.  There is no scleral icterus.  The mouth and pharynx are normal.  The neck is supple.  The carotids reveal no bruits.  The jugular venous pressure is normal.  The  thyroid is not enlarged.  There is no lymphadenopathy.  The chest is clear to percussion and auscultation.  There are no rales or rhonchi.  Expansion of the chest is symmetrical.  The precordium is quiet.  The pulse is irregularly irregular.  The first heart sound is normal.  The second heart sound is physiologically split.  There is no murmur gallop rub or click.  There is no abnormal lift or heave.  The abdomen is soft and nontender.  The bowel sounds are normal.  The liver and spleen are not enlarged.  There are no abdominal masses.  There are no abdominal bruits.  Extremities reveal good pedal pulses.  There is no phlebitis or edema.  There is no cyanosis or clubbing.  Strength is normal and symmetrical in all extremities.  There is no lateralizing weakness.   There are no sensory deficits.  The skin is warm and dry.  There is no rash.      Assessment / Plan: Continue same medication.  Recheck in 4 months for followup office visit.  She has gained 5 pounds since last visit which she will try to lose.  She has not been able to walk much recently because of the accident slipping on the ice and straining a muscle in her leg.

## 2013-01-22 NOTE — Assessment & Plan Note (Signed)
The patient remains on long-term Coumadin.  She has not been having any side effects or problems from the Coumadin.  She has not had any TIA or stroke symptoms.

## 2013-02-19 ENCOUNTER — Ambulatory Visit (INDEPENDENT_AMBULATORY_CARE_PROVIDER_SITE_OTHER): Payer: Medicare Other | Admitting: Pharmacist

## 2013-02-19 DIAGNOSIS — I4891 Unspecified atrial fibrillation: Secondary | ICD-10-CM

## 2013-02-19 DIAGNOSIS — Z7901 Long term (current) use of anticoagulants: Secondary | ICD-10-CM

## 2013-02-19 LAB — POCT INR: INR: 3.2

## 2013-03-12 ENCOUNTER — Ambulatory Visit (INDEPENDENT_AMBULATORY_CARE_PROVIDER_SITE_OTHER): Payer: Medicare Other | Admitting: Pharmacist

## 2013-03-12 DIAGNOSIS — I4891 Unspecified atrial fibrillation: Secondary | ICD-10-CM

## 2013-03-12 DIAGNOSIS — Z7901 Long term (current) use of anticoagulants: Secondary | ICD-10-CM

## 2013-03-12 LAB — POCT INR: INR: 2.7

## 2013-04-09 ENCOUNTER — Ambulatory Visit (INDEPENDENT_AMBULATORY_CARE_PROVIDER_SITE_OTHER): Payer: Medicare Other

## 2013-04-09 DIAGNOSIS — Z7901 Long term (current) use of anticoagulants: Secondary | ICD-10-CM

## 2013-04-09 DIAGNOSIS — I4891 Unspecified atrial fibrillation: Secondary | ICD-10-CM

## 2013-04-09 LAB — POCT INR: INR: 2.5

## 2013-05-16 ENCOUNTER — Ambulatory Visit (INDEPENDENT_AMBULATORY_CARE_PROVIDER_SITE_OTHER): Payer: Medicare Other | Admitting: *Deleted

## 2013-05-16 ENCOUNTER — Ambulatory Visit (INDEPENDENT_AMBULATORY_CARE_PROVIDER_SITE_OTHER): Payer: Medicare Other | Admitting: Cardiology

## 2013-05-16 ENCOUNTER — Encounter: Payer: Self-pay | Admitting: Cardiology

## 2013-05-16 VITALS — BP 144/78 | HR 76 | Ht 63.0 in | Wt 156.0 lb

## 2013-05-16 DIAGNOSIS — I10 Essential (primary) hypertension: Secondary | ICD-10-CM

## 2013-05-16 DIAGNOSIS — R0989 Other specified symptoms and signs involving the circulatory and respiratory systems: Secondary | ICD-10-CM | POA: Insufficient documentation

## 2013-05-16 DIAGNOSIS — I4891 Unspecified atrial fibrillation: Secondary | ICD-10-CM

## 2013-05-16 DIAGNOSIS — I059 Rheumatic mitral valve disease, unspecified: Secondary | ICD-10-CM

## 2013-05-16 DIAGNOSIS — I340 Nonrheumatic mitral (valve) insufficiency: Secondary | ICD-10-CM

## 2013-05-16 DIAGNOSIS — Z7901 Long term (current) use of anticoagulants: Secondary | ICD-10-CM

## 2013-05-16 LAB — POCT INR: INR: 4.1

## 2013-05-16 NOTE — Assessment & Plan Note (Signed)
No symptoms of heart failure.  No orthopnea or paroxysmal nocturnal dyspnea.

## 2013-05-16 NOTE — Assessment & Plan Note (Signed)
Blood pressure today was surprisingly a little high.  At home when she does check her blood pressure it has been in good.  She has not checked it recently.  She will start monitoring at home and let us know if it persists in being elevated.  She has lost 4 pounds since last visit and she will continue on careful prudent diet.

## 2013-05-16 NOTE — Assessment & Plan Note (Signed)
The patient is unaware of her heart beat.  Her atrial fibrillation is asymptomatic.  She has not had any TIA symptoms. her Coumadin is not causing any side effects.

## 2013-05-16 NOTE — Patient Instructions (Addendum)
Your physician recommends that you continue on your current medications as directed. Please refer to the Current Medication list given to you today.  Your physician wants you to follow-up in: 4 month ov You will receive a reminder letter in the mail two months in advance. If you don't receive a letter, please call our office to schedule the follow-up appointment.  

## 2013-05-16 NOTE — Progress Notes (Signed)
Kathryn Chang Date of Birth:  1940/11/24 [facility] [address] [place], [place]  [address] [phone]  Fax   [phone]  HPI: This pleasant 72 year old woman is seen for a scheduled followup office visit. She has a history of established atrial fibrillation. She is on long-term Coumadin. She has been feeling well since last visit. Her last echocardiogram was 10/29/10 and demonstrated  - Left ventricle: The cavity size was normal. Wall thickness was normal. Systolic function was normal. The estimated ejection fraction was in the range of 55% to 60%. - Aortic valve: Mild regurgitation. - Mitral valve: Mild regurgitation. - Right atrium: The atrium was mildly dilated. - Pulmonary arteries: PA peak pressure: 35mm Hg (S).  Since last visit she's had no new cardiac symptoms.  She has been exercising with water aerobics and yoga.  Denies any chest pain or shortness of breath.  Not aware of any palpitations.  Current Outpatient Prescriptions  Medication Sig Dispense Refill  . Alendronate Sodium (FOSAMAX PO) Take by mouth once a week.        Marland Kitchen aspirin 81 MG tablet Take 81 mg by mouth daily.        . Calcium Carbonate-Vitamin D (CALCIUM + D PO) Take 400 mg by mouth daily.        . Cholecalciferol (VITAMIN D PO) Take 2,000 Units by mouth daily.        . diclofenac sodium (VOLTAREN) 1 % GEL Apply topically daily as needed.      . digoxin (LANOXIN) 0.25 MG tablet Take 1 tablet (250 mcg total) by mouth daily.  90 tablet  3  . Docusate Calcium (STOOL SOFTENER PO) Take by mouth as needed.        . fish oil-omega-3 fatty acids 1000 MG capsule Take 1 g by mouth daily.        . Multiple Vitamin (MULTIVITAMIN PO) Take by mouth daily.        . traMADol (ULTRAM) 50 MG tablet Take 50 mg by mouth. take 1 to 2 tabs every 6 hours as needed for pain      . warfarin (COUMADIN) 5 MG tablet Take 1 tablet (5 mg total) by mouth daily. This is a 90 day supply, take as directed by  coumadin clinic  105 tablet  1   No current facility-administered medications for this visit.    Allergies  Allergen Reactions  . Shellfish-Derived Products     Patient Active Problem List   Diagnosis Date Noted  . Atrial fibrillation 03/09/2011    Priority: High  . Mitral regurgitation 01/22/2013  . Encounter for long-term (current) use of anticoagulants 03/09/2011    History  Smoking status  . Never Smoker   Smokeless tobacco  . Not on file    History  Alcohol Use: Not on file    Family History  Problem Relation Age of Onset  . Hypertension Father   . Heart disease Father     Review of Systems: The patient denies any heat or cold intolerance.  No weight gain or weight loss.  The patient denies headaches or blurry vision.  There is no cough or sputum production.  The patient denies dizziness.  There is no hematuria or hematochezia.  The patient denies any muscle aches or arthritis.  The patient denies any rash.  The patient denies frequent falling or instability.  There is no history of depression or anxiety.  All other systems were reviewed and are negative.  Physical Exam: Filed Vitals:   05/16/13 1323  BP: 144/78  Pulse: 76      Assessment / Plan:

## 2013-05-31 ENCOUNTER — Ambulatory Visit (INDEPENDENT_AMBULATORY_CARE_PROVIDER_SITE_OTHER): Payer: Medicare Other

## 2013-05-31 DIAGNOSIS — I4891 Unspecified atrial fibrillation: Secondary | ICD-10-CM

## 2013-05-31 DIAGNOSIS — Z7901 Long term (current) use of anticoagulants: Secondary | ICD-10-CM

## 2013-06-21 ENCOUNTER — Ambulatory Visit (INDEPENDENT_AMBULATORY_CARE_PROVIDER_SITE_OTHER): Payer: Medicare Other

## 2013-06-21 DIAGNOSIS — Z7901 Long term (current) use of anticoagulants: Secondary | ICD-10-CM

## 2013-06-21 DIAGNOSIS — I4891 Unspecified atrial fibrillation: Secondary | ICD-10-CM

## 2013-07-04 ENCOUNTER — Ambulatory Visit (INDEPENDENT_AMBULATORY_CARE_PROVIDER_SITE_OTHER): Payer: Medicare Other | Admitting: Pharmacist

## 2013-07-04 DIAGNOSIS — Z7901 Long term (current) use of anticoagulants: Secondary | ICD-10-CM

## 2013-07-04 DIAGNOSIS — I4891 Unspecified atrial fibrillation: Secondary | ICD-10-CM

## 2013-07-25 ENCOUNTER — Ambulatory Visit (INDEPENDENT_AMBULATORY_CARE_PROVIDER_SITE_OTHER): Payer: Medicare Other | Admitting: *Deleted

## 2013-07-25 DIAGNOSIS — Z7901 Long term (current) use of anticoagulants: Secondary | ICD-10-CM

## 2013-07-25 DIAGNOSIS — I4891 Unspecified atrial fibrillation: Secondary | ICD-10-CM

## 2013-07-25 LAB — POCT INR: INR: 1.1

## 2013-08-06 ENCOUNTER — Ambulatory Visit (INDEPENDENT_AMBULATORY_CARE_PROVIDER_SITE_OTHER): Payer: Medicare Other | Admitting: *Deleted

## 2013-08-06 DIAGNOSIS — I4891 Unspecified atrial fibrillation: Secondary | ICD-10-CM

## 2013-08-06 DIAGNOSIS — Z7901 Long term (current) use of anticoagulants: Secondary | ICD-10-CM

## 2013-08-14 ENCOUNTER — Other Ambulatory Visit: Payer: Self-pay | Admitting: Pharmacist

## 2013-08-14 MED ORDER — WARFARIN SODIUM 5 MG PO TABS: 5.0000 mg | ORAL_TABLET | Freq: Every day | ORAL | Status: DC

## 2013-08-22 ENCOUNTER — Ambulatory Visit (INDEPENDENT_AMBULATORY_CARE_PROVIDER_SITE_OTHER): Payer: Medicare Other | Admitting: *Deleted

## 2013-08-22 DIAGNOSIS — I4891 Unspecified atrial fibrillation: Secondary | ICD-10-CM

## 2013-08-22 DIAGNOSIS — Z7901 Long term (current) use of anticoagulants: Secondary | ICD-10-CM

## 2013-09-05 ENCOUNTER — Ambulatory Visit (INDEPENDENT_AMBULATORY_CARE_PROVIDER_SITE_OTHER): Payer: Medicare Other | Admitting: Pharmacist

## 2013-09-05 DIAGNOSIS — Z7901 Long term (current) use of anticoagulants: Secondary | ICD-10-CM

## 2013-09-05 DIAGNOSIS — I4891 Unspecified atrial fibrillation: Secondary | ICD-10-CM

## 2013-09-05 LAB — POCT INR: INR: 1.9

## 2013-09-25 ENCOUNTER — Ambulatory Visit (INDEPENDENT_AMBULATORY_CARE_PROVIDER_SITE_OTHER): Payer: Medicare Other | Admitting: General Practice

## 2013-09-25 DIAGNOSIS — I4891 Unspecified atrial fibrillation: Secondary | ICD-10-CM

## 2013-09-25 DIAGNOSIS — Z7901 Long term (current) use of anticoagulants: Secondary | ICD-10-CM

## 2013-09-25 LAB — POCT INR: INR: 2

## 2013-10-19 ENCOUNTER — Other Ambulatory Visit: Payer: Self-pay | Admitting: *Deleted

## 2013-10-19 MED ORDER — DIGOXIN 250 MCG PO TABS: 250.0000 ug | ORAL_TABLET | Freq: Every day | ORAL | Status: DC

## 2013-10-23 ENCOUNTER — Ambulatory Visit (INDEPENDENT_AMBULATORY_CARE_PROVIDER_SITE_OTHER): Payer: Medicare Other | Admitting: Pharmacist

## 2013-10-23 ENCOUNTER — Telehealth: Payer: Self-pay | Admitting: *Deleted

## 2013-10-23 DIAGNOSIS — Z7901 Long term (current) use of anticoagulants: Secondary | ICD-10-CM

## 2013-10-23 DIAGNOSIS — I4891 Unspecified atrial fibrillation: Secondary | ICD-10-CM

## 2013-10-23 LAB — POCT INR: INR: 1.9

## 2013-10-23 NOTE — Telephone Encounter (Signed)
PA for digoxin approved, will receive letter, we were notified by phone from [facility]

## 2013-10-30 NOTE — Telephone Encounter (Signed)
[employer] approval for digoxin 10/22/2013-10/22/2014

## 2013-11-01 ENCOUNTER — Telehealth: Payer: Self-pay | Admitting: Cardiology

## 2013-11-01 ENCOUNTER — Encounter: Payer: Self-pay | Admitting: Cardiology

## 2013-11-01 NOTE — Telephone Encounter (Signed)
Walk In Pt Form " Mail order" paper Dropped Off gave to [facility]

## 2013-11-16 ENCOUNTER — Telehealth: Payer: Self-pay

## 2013-11-16 MED ORDER — WARFARIN SODIUM 5 MG PO TABS: 5.0000 mg | ORAL_TABLET | Freq: Every day | ORAL | Status: DC

## 2013-11-16 MED ORDER — DIGOXIN 250 MCG PO TABS: 250.0000 ug | ORAL_TABLET | Freq: Every day | ORAL | Status: DC

## 2013-11-16 NOTE — Telephone Encounter (Signed)
Refill

## 2013-11-16 NOTE — Addendum Note (Signed)
Addended by: Elberta Leatherwood R on: 11/16/2013 12:33 PM   Modules accepted: Orders

## 2013-11-21 ENCOUNTER — Encounter: Payer: Self-pay | Admitting: Cardiology

## 2013-11-21 ENCOUNTER — Ambulatory Visit (INDEPENDENT_AMBULATORY_CARE_PROVIDER_SITE_OTHER): Payer: Commercial Managed Care - HMO | Admitting: *Deleted

## 2013-11-21 ENCOUNTER — Ambulatory Visit (INDEPENDENT_AMBULATORY_CARE_PROVIDER_SITE_OTHER): Payer: Medicare HMO | Admitting: Cardiology

## 2013-11-21 VITALS — BP 132/80 | HR 87 | Ht 63.0 in | Wt 153.4 lb

## 2013-11-21 DIAGNOSIS — I4891 Unspecified atrial fibrillation: Secondary | ICD-10-CM | POA: Diagnosis not present

## 2013-11-21 DIAGNOSIS — I10 Essential (primary) hypertension: Secondary | ICD-10-CM | POA: Diagnosis not present

## 2013-11-21 DIAGNOSIS — I059 Rheumatic mitral valve disease, unspecified: Secondary | ICD-10-CM | POA: Diagnosis not present

## 2013-11-21 DIAGNOSIS — R0989 Other specified symptoms and signs involving the circulatory and respiratory systems: Secondary | ICD-10-CM

## 2013-11-21 DIAGNOSIS — I340 Nonrheumatic mitral (valve) insufficiency: Secondary | ICD-10-CM

## 2013-11-21 DIAGNOSIS — Z7901 Long term (current) use of anticoagulants: Secondary | ICD-10-CM

## 2013-11-21 DIAGNOSIS — Z5181 Encounter for therapeutic drug level monitoring: Secondary | ICD-10-CM | POA: Insufficient documentation

## 2013-11-21 LAB — POCT INR: INR: 1.8

## 2013-11-21 MED ORDER — DIGOXIN 250 MCG PO TABS: 250.0000 ug | ORAL_TABLET | Freq: Every day | ORAL | Status: DC

## 2013-11-21 NOTE — Progress Notes (Signed)
Kathryn Chang Date of Birth:  08-Mar-1941 [address] [place], [place]  [address] [phone]  Fax   [phone]  HPI: This pleasant 73 year old woman is seen for a scheduled followup office visit. She has a history of established atrial fibrillation. She is on long-term Coumadin. She has been feeling well since last visit. Her last echocardiogram was 10/29/10 and demonstrated  - Left ventricle: The cavity size was normal. Wall thickness was normal. Systolic function was normal. The estimated ejection fraction was in the range of 55% to 60%. - Aortic valve: Mild regurgitation. - Mitral valve: Mild regurgitation. - Right atrium: The atrium was mildly dilated. - Pulmonary arteries: PA peak pressure: 71mm Hg (S).  Since last visit she's had no new cardiac symptoms.  She has been exercising with water aerobics and yoga.  Denies any chest pain or shortness of breath.  Not aware of any palpitations.  Since we last saw her she [place] Current Outpatient Prescriptions  Medication Sig Dispense Refill  . Alendronate Sodium (FOSAMAX PO) Take by mouth once a week.        . Calcium Carbonate-Vitamin D (CALCIUM + D PO) Take 800 mg by mouth daily.       . Cholecalciferol (VITAMIN D PO) Take 2,000 Units by mouth daily.        . digoxin (LANOXIN) 0.25 MG tablet Take 1 tablet (250 mcg total) by mouth daily.  14 tablet  1  . Docusate Calcium (STOOL SOFTENER PO) Take by mouth as needed.        . fish oil-omega-3 fatty acids 1000 MG capsule Take 1 g by mouth daily.        . Multiple Vitamin (MULTIVITAMIN PO) Take by mouth daily.        Marland Kitchen warfarin (COUMADIN) 5 MG tablet Take 1 tablet (5 mg total) by mouth daily. This is a 90 day supply, take as directed by coumadin clinic  100 tablet  1  . diclofenac sodium (VOLTAREN) 1 % GEL Apply topically daily as needed.      . traMADol (ULTRAM) 50 MG tablet Take 50 mg by mouth. take 1 to 2 tabs every 6  hours as needed for pain       No current facility-administered medications for this visit.    Allergies  Allergen Reactions  . Shellfish-Derived Products     Patient Active Problem List   Diagnosis Date Noted  . Atrial fibrillation 03/09/2011    Priority: High  . Encounter for therapeutic drug monitoring 11/21/2013  . Labile hypertension 05/16/2013  . Mitral regurgitation 01/22/2013  . Encounter for long-term (current) use of anticoagulants 03/09/2011    History  Smoking status  . Never Smoker   Smokeless tobacco  . Not on file    History  Alcohol Use: Not on file    Family History  Problem Relation Age of Onset  . Hypertension Father   . Heart disease Father     Review of Systems: The patient denies any heat or cold intolerance.  No weight gain or weight loss.  The patient denies headaches or blurry vision.  There is no cough or sputum production.  The patient denies dizziness.  There is no hematuria or hematochezia.  The patient denies any muscle aches or arthritis.  The patient denies any rash.  The patient denies frequent falling or instability.  There is no history of depression or anxiety.  All other systems were reviewed and are negative.   Physical Exam: Filed Vitals:   11/21/13 1406  BP: 132/80  Pulse:    the pulse is 79 and irregularly irregular The head and neck exam reveals pupils equal and reactive.  Extraocular movements are full.  There is no scleral icterus.  The mouth and pharynx are normal.  The neck is supple.  The carotids reveal no bruits.  The jugular venous pressure is normal.  The  thyroid is not enlarged.  There is no lymphadenopathy.  The chest is clear to percussion and auscultation.  There are no rales or rhonchi.  Expansion of the chest is symmetrical.  The precordium is quiet.  Pulse is irregularly irregular  The first heart sound is normal.  The second heart sound is physiologically split.  There is no murmur gallop rub or click.  There  is no abnormal lift or heave.  The abdomen is soft and nontender.  The bowel sounds are normal.  The liver and spleen are not enlarged.  There are no abdominal masses.  There are no abdominal bruits.  Extremities reveal good pedal pulses.  There is no phlebitis or edema.  There is no cyanosis or clubbing.  Strength is normal and symmetrical in all extremities.  There is no lateralizing weakness.  There are no sensory deficits.  The skin is warm and dry.  There is no rash.  EKG shows atrial fibrillation with controlled ventricular response.  Nonspecific ST-T wave changes are present unchanged from 08/22/12    Assessment / Plan: Continue same medication except no indication to continue baby aspirin and any further. Recheck in 4 months for followup office visit.

## 2013-11-21 NOTE — Assessment & Plan Note (Signed)
The patient has not had any TIA symptoms.  She remains on long-term Coumadin.  She does not need to continue taking aspirin as well and we will stop her baby aspirin at this time.

## 2013-11-21 NOTE — Assessment & Plan Note (Signed)
Patient is not having any symptoms from her mild mitral regurgitation

## 2013-11-21 NOTE — Patient Instructions (Signed)
STOP THE ASPIRIN 65 MG  Your physician wants you to follow-up in: [place] will receive a reminder letter in the mail two months in advance. If you don't receive a letter, please call our office to schedule the follow-up appointment.

## 2013-12-05 ENCOUNTER — Ambulatory Visit (INDEPENDENT_AMBULATORY_CARE_PROVIDER_SITE_OTHER): Payer: Medicare HMO | Admitting: *Deleted

## 2013-12-05 DIAGNOSIS — Z7901 Long term (current) use of anticoagulants: Secondary | ICD-10-CM

## 2013-12-05 DIAGNOSIS — Z5181 Encounter for therapeutic drug level monitoring: Secondary | ICD-10-CM

## 2013-12-05 DIAGNOSIS — I4891 Unspecified atrial fibrillation: Secondary | ICD-10-CM

## 2013-12-05 LAB — POCT INR: INR: 3.3

## 2013-12-26 ENCOUNTER — Ambulatory Visit (INDEPENDENT_AMBULATORY_CARE_PROVIDER_SITE_OTHER): Payer: Medicare HMO | Admitting: Pharmacist

## 2013-12-26 DIAGNOSIS — Z5181 Encounter for therapeutic drug level monitoring: Secondary | ICD-10-CM

## 2013-12-26 DIAGNOSIS — Z7901 Long term (current) use of anticoagulants: Secondary | ICD-10-CM

## 2013-12-26 DIAGNOSIS — I4891 Unspecified atrial fibrillation: Secondary | ICD-10-CM

## 2013-12-26 LAB — POCT INR: INR: 3.8

## 2014-01-09 ENCOUNTER — Ambulatory Visit (INDEPENDENT_AMBULATORY_CARE_PROVIDER_SITE_OTHER): Payer: Medicare HMO | Admitting: *Deleted

## 2014-01-09 DIAGNOSIS — Z5181 Encounter for therapeutic drug level monitoring: Secondary | ICD-10-CM

## 2014-01-09 DIAGNOSIS — Z7901 Long term (current) use of anticoagulants: Secondary | ICD-10-CM

## 2014-01-09 DIAGNOSIS — I4891 Unspecified atrial fibrillation: Secondary | ICD-10-CM

## 2014-01-09 LAB — POCT INR: INR: 2.3

## 2014-02-04 ENCOUNTER — Ambulatory Visit (INDEPENDENT_AMBULATORY_CARE_PROVIDER_SITE_OTHER): Payer: Medicare HMO | Admitting: *Deleted

## 2014-02-04 DIAGNOSIS — I4891 Unspecified atrial fibrillation: Secondary | ICD-10-CM

## 2014-02-04 DIAGNOSIS — Z5181 Encounter for therapeutic drug level monitoring: Secondary | ICD-10-CM

## 2014-02-04 DIAGNOSIS — Z7901 Long term (current) use of anticoagulants: Secondary | ICD-10-CM

## 2014-02-04 LAB — POCT INR: INR: 1.9

## 2014-02-19 ENCOUNTER — Ambulatory Visit (INDEPENDENT_AMBULATORY_CARE_PROVIDER_SITE_OTHER): Payer: Commercial Managed Care - HMO

## 2014-02-19 DIAGNOSIS — I4891 Unspecified atrial fibrillation: Secondary | ICD-10-CM

## 2014-02-19 DIAGNOSIS — Z7901 Long term (current) use of anticoagulants: Secondary | ICD-10-CM

## 2014-02-19 DIAGNOSIS — Z5181 Encounter for therapeutic drug level monitoring: Secondary | ICD-10-CM

## 2014-02-19 LAB — POCT INR: INR: 2.2

## 2014-03-12 ENCOUNTER — Ambulatory Visit (INDEPENDENT_AMBULATORY_CARE_PROVIDER_SITE_OTHER): Payer: Commercial Managed Care - HMO | Admitting: *Deleted

## 2014-03-12 DIAGNOSIS — I4891 Unspecified atrial fibrillation: Secondary | ICD-10-CM

## 2014-03-12 DIAGNOSIS — Z7901 Long term (current) use of anticoagulants: Secondary | ICD-10-CM

## 2014-03-12 DIAGNOSIS — Z5181 Encounter for therapeutic drug level monitoring: Secondary | ICD-10-CM

## 2014-03-12 LAB — POCT INR: INR: 2.4

## 2014-04-01 ENCOUNTER — Ambulatory Visit (INDEPENDENT_AMBULATORY_CARE_PROVIDER_SITE_OTHER): Payer: Commercial Managed Care - HMO | Admitting: *Deleted

## 2014-04-01 ENCOUNTER — Encounter: Payer: Self-pay | Admitting: Cardiology

## 2014-04-01 ENCOUNTER — Ambulatory Visit (INDEPENDENT_AMBULATORY_CARE_PROVIDER_SITE_OTHER): Payer: Commercial Managed Care - HMO | Admitting: Cardiology

## 2014-04-01 VITALS — BP 130/68 | HR 80 | Ht 63.0 in | Wt 143.0 lb

## 2014-04-01 DIAGNOSIS — Z7901 Long term (current) use of anticoagulants: Secondary | ICD-10-CM | POA: Diagnosis not present

## 2014-04-01 DIAGNOSIS — I340 Nonrheumatic mitral (valve) insufficiency: Secondary | ICD-10-CM

## 2014-04-01 DIAGNOSIS — I4891 Unspecified atrial fibrillation: Secondary | ICD-10-CM

## 2014-04-01 DIAGNOSIS — Z5181 Encounter for therapeutic drug level monitoring: Secondary | ICD-10-CM

## 2014-04-01 DIAGNOSIS — I059 Rheumatic mitral valve disease, unspecified: Secondary | ICD-10-CM | POA: Diagnosis not present

## 2014-04-01 LAB — POCT INR: INR: 2.1

## 2014-04-01 NOTE — Patient Instructions (Signed)
Your physician recommends that you continue on your current medications as directed. Please refer to the Current Medication list given to you today.  Your physician wants you to follow-up in: 4 MONTH OV You will receive a reminder letter in the mail two months in advance. If you don't receive a letter, please call our office to schedule the follow-up appointment.  

## 2014-04-01 NOTE — Progress Notes (Signed)
Jon Gills Derk Date of Birth:  Mar 04, 1941 [facility] [address] [place], [place]  [address] [phone]  Fax   [phone]  HPI: This pleasant 73 year old woman is seen for a scheduled four-month followup office visit.  She has a history of established atrial fibrillation and is on long-term Coumadin.  She had an echocardiogram on 10/29/10 showing normal LV systolic function with ejection fraction 55-60%.  There was mild aortic regurgitation and mild mitral regurgitation and the pulmonary artery pressure was 35 Since last visit she's had no new cardiac symptoms.  She has not been experiencing any excessive bruising or bleeding from the Coumadin.  She denies chest pain or shortness of breath.  He has not had any TIA symptoms.  Current Outpatient Prescriptions  Medication Sig Dispense Refill  . Alendronate Sodium (FOSAMAX PO) Take by mouth once a week.        . Calcium Carbonate-Vitamin D (CALCIUM + D PO) Take 800 mg by mouth daily.       . Cholecalciferol (VITAMIN D PO) Take 2,000 Units by mouth daily.        . digoxin (LANOXIN) 0.25 MG tablet Take 1 tablet (250 mcg total) by mouth daily.  14 tablet  1  . Docusate Calcium (STOOL SOFTENER PO) Take by mouth as needed.        . fish oil-omega-3 fatty acids 1000 MG capsule Take 1 g by mouth daily.        . Multiple Vitamin (MULTIVITAMIN PO) Take by mouth daily.        Marland Kitchen warfarin (COUMADIN) 5 MG tablet Take 1 tablet (5 mg total) by mouth daily. This is a 90 day supply, take as directed by coumadin clinic  100 tablet  1   No current facility-administered medications for this visit.    Allergies  Allergen Reactions  . Shellfish-Derived Products     Patient Active Problem List   Diagnosis Date Noted  . Atrial fibrillation 03/09/2011    Priority: High  . Encounter for therapeutic drug monitoring 11/21/2013  . Labile hypertension 05/16/2013  . Mitral regurgitation 01/22/2013  . Encounter for long-term  (current) use of anticoagulants 03/09/2011    History  Smoking status  . Never Smoker   Smokeless tobacco  . Not on file    History  Alcohol Use: Not on file    Family History  Problem Relation Age of Onset  . Hypertension Father   . Heart disease Father     Review of Systems: The patient denies any heat or cold intolerance.  No weight gain or weight loss.  The patient denies headaches or blurry vision.  There is no cough or sputum production.  The patient denies dizziness.  There is no hematuria or hematochezia.  The patient denies any muscle aches or arthritis.  The patient denies any rash.  The patient denies frequent falling or instability.  There is no history of depression or anxiety.  All other systems were reviewed and are negative.   Physical Exam: Filed Vitals:   04/01/14 1340  BP: 130/68  Pulse: 80   the general appearance reveals a well-developed well-nourished woman in no distress.The head and neck exam reveals pupils equal and reactive.  Extraocular movements are full.  There is no scleral icterus.  The mouth and pharynx are normal.  The neck is supple.  The carotids reveal no bruits.  The jugular venous pressure is normal.  The  thyroid is not enlarged.  There is no lymphadenopathy.  The chest is clear to percussion and auscultation.  There are no rales or rhonchi.  Expansion of the chest is symmetrical.  The precordium is quiet.  The pulse is irregularly irregular.  The first heart sound is normal.  The second heart sound is physiologically split.  There is no murmur gallop rub or click.  There is no abnormal lift or heave.  The abdomen is soft and nontender.  The bowel sounds are normal.  The liver and spleen are not enlarged.  There are no abdominal masses.  There are no abdominal bruits.  Extremities reveal good pedal pulses.  There is no phlebitis or edema.  There is no cyanosis or clubbing.  Strength is normal and symmetrical in all extremities.  There is no  lateralizing weakness.  There are no sensory deficits.  The skin is warm and dry.  There is no rash.      Assessment / Plan: 1. established atrial fibrillation. 2. mitral regurgitation  Plan: Continue same medication.  Recheck in 4 months. The patient has been doing well  on a diet plan and has lost 10 pounds since last visit.

## 2014-04-29 ENCOUNTER — Ambulatory Visit (INDEPENDENT_AMBULATORY_CARE_PROVIDER_SITE_OTHER): Payer: Commercial Managed Care - HMO | Admitting: *Deleted

## 2014-04-29 DIAGNOSIS — Z5181 Encounter for therapeutic drug level monitoring: Secondary | ICD-10-CM

## 2014-04-29 DIAGNOSIS — I4891 Unspecified atrial fibrillation: Secondary | ICD-10-CM

## 2014-04-29 DIAGNOSIS — Z7901 Long term (current) use of anticoagulants: Secondary | ICD-10-CM

## 2014-04-29 LAB — POCT INR: INR: 2.7

## 2014-05-09 NOTE — Telephone Encounter (Signed)
Close Encounter 

## 2014-06-03 ENCOUNTER — Other Ambulatory Visit: Payer: Self-pay | Admitting: Cardiology

## 2014-06-14 ENCOUNTER — Ambulatory Visit (INDEPENDENT_AMBULATORY_CARE_PROVIDER_SITE_OTHER): Payer: Commercial Managed Care - HMO | Admitting: Pharmacist

## 2014-06-14 DIAGNOSIS — Z7901 Long term (current) use of anticoagulants: Secondary | ICD-10-CM

## 2014-06-14 DIAGNOSIS — Z5181 Encounter for therapeutic drug level monitoring: Secondary | ICD-10-CM

## 2014-06-14 DIAGNOSIS — I4891 Unspecified atrial fibrillation: Secondary | ICD-10-CM

## 2014-06-14 LAB — POCT INR: INR: 2.6

## 2014-07-24 ENCOUNTER — Ambulatory Visit (INDEPENDENT_AMBULATORY_CARE_PROVIDER_SITE_OTHER): Payer: Commercial Managed Care - HMO | Admitting: *Deleted

## 2014-07-24 DIAGNOSIS — Z5181 Encounter for therapeutic drug level monitoring: Secondary | ICD-10-CM

## 2014-07-24 DIAGNOSIS — Z7901 Long term (current) use of anticoagulants: Secondary | ICD-10-CM | POA: Diagnosis not present

## 2014-07-24 DIAGNOSIS — I4891 Unspecified atrial fibrillation: Secondary | ICD-10-CM

## 2014-07-24 LAB — POCT INR: INR: 2.2

## 2014-08-16 ENCOUNTER — Ambulatory Visit (INDEPENDENT_AMBULATORY_CARE_PROVIDER_SITE_OTHER): Payer: Commercial Managed Care - HMO | Admitting: Cardiology

## 2014-08-16 VITALS — BP 128/76 | HR 68 | Ht 64.0 in | Wt 145.0 lb

## 2014-08-16 DIAGNOSIS — I340 Nonrheumatic mitral (valve) insufficiency: Secondary | ICD-10-CM

## 2014-08-16 DIAGNOSIS — I482 Chronic atrial fibrillation, unspecified: Secondary | ICD-10-CM

## 2014-08-16 NOTE — Progress Notes (Signed)
Jon Gills Scullion Date of Birth:  [birth year] [facility] [address] [place],   [address] [phone]  Fax   [phone]  HPI: This pleasant 73 year old woman is seen for a scheduled four-month followup office visit.  She has a history of established atrial fibrillation and is on long-term Coumadin.  She had an echocardiogram on 10/29/10 showing normal LV systolic function with ejection fraction 55-60%.  There was mild aortic regurgitation and mild mitral regurgitation and the pulmonary artery pressure was 35 Since last visit she's had no new cardiac symptoms.  She has not been experiencing any excessive bruising or bleeding from the Coumadin.  She denies chest pain or shortness of breath.  He has not had any TIA symptoms. She is still having some foot problems with her left foot.  She has seen an orthopedist Dr. Doran Durand.  She is going to obtain some good walking shoes and try to increase walking ambulation.  She has gained 2 pounds since last visit.  Current Outpatient Prescriptions  Medication Sig Dispense Refill  . Alendronate Sodium (FOSAMAX PO) Take by mouth once a week.        . Calcium Carbonate-Vitamin D (CALCIUM + D PO) Take 800 mg by mouth daily.       . Cholecalciferol (VITAMIN D PO) Take 2,000 Units by mouth daily.        . digoxin (LANOXIN) 0.25 MG tablet TAKE 1 TABLET EVERY DAY  90 tablet  0  . Docusate Calcium (STOOL SOFTENER PO) Take by mouth as needed.        . fish oil-omega-3 fatty acids 1000 MG capsule Take 1 g by mouth daily.        . Multiple Vitamin (MULTIVITAMIN PO) Take by mouth daily.        Marland Kitchen warfarin (COUMADIN) 5 MG tablet Take as directed by coumadin clinic  100 tablet  1   No current facility-administered medications for this visit.    Allergies  Allergen Reactions  . Shellfish-Derived Products     Patient Active Problem List   Diagnosis Date Noted  . Atrial fibrillation 03/09/2011    Priority: High  . Encounter  for therapeutic drug monitoring 11/21/2013  . Mitral regurgitation 01/22/2013  . Encounter for long-term (current) use of anticoagulants 03/09/2011    History  Smoking status  . Never Smoker   Smokeless tobacco  . Not on file    History  Alcohol Use: Not on file    Family History  Problem Relation Age of Onset  . Hypertension Father   . Heart disease Father     Review of Systems: The patient denies any heat or cold intolerance.  No weight gain or weight loss.  The patient denies headaches or blurry vision.  There is no cough or sputum production.  The patient denies dizziness.  There is no hematuria or hematochezia.  The patient denies any muscle aches or arthritis.  The patient denies any rash.  The patient denies frequent falling or instability.  There is no history of depression or anxiety.  All other systems were reviewed and are negative.   Physical Exam: Filed Vitals:   08/16/14 1139  BP: 128/76  Pulse: 68   the general appearance reveals a well-developed well-nourished woman in no distress.The head and neck exam reveals pupils equal and reactive.  Extraocular movements are full.  There is no scleral icterus.  The mouth and pharynx are normal.  The neck is supple.  The carotids reveal no bruits.  The jugular venous pressure is normal.  The  thyroid is not enlarged.  There is no lymphadenopathy.  The chest is clear to percussion and auscultation.  There are no rales or rhonchi.  Expansion of the chest is symmetrical.  The precordium is quiet.  The pulse is irregularly irregular.  The first heart sound is normal.  The second heart sound is physiologically split.  There is no murmur gallop rub or click.  There is no abnormal lift or heave.  The abdomen is soft and nontender.  The bowel sounds are normal.  The liver and spleen are not enlarged.  There are no abdominal masses.  There are no abdominal bruits.  Extremities reveal good pedal pulses.  There is no phlebitis or edema.   There is no cyanosis or clubbing.  Strength is normal and symmetrical in all extremities.  There is no lateralizing weakness.  There are no sensory deficits.  The skin is warm and dry.  There is no rash.      Assessment / Plan: 1. established atrial fibrillation. 2. mitral regurgitation 3. left foot pain, seeing orthopedist  Plan: Continue same medication.  Recheck in 4 months for office visit and EKG. Try to increase walking exercise

## 2014-08-16 NOTE — Patient Instructions (Signed)
Your physician recommends that you continue on your current medications as directed. Please refer to the Current Medication list given to you today.  Your physician wants you to follow-up in: [place] will receive a reminder letter in the mail two months in advance. If you don't receive a letter, please call our office to schedule the follow-up appointment.

## 2014-08-16 NOTE — Assessment & Plan Note (Signed)
Patient is not aware of any racing of her heart.  No chest pain or shortness of breath.  No TIA symptoms.  No bleeding problems from her warfarin.

## 2014-08-16 NOTE — Assessment & Plan Note (Signed)
No symptoms of orthopnea or paroxysmal nocturnal dyspnea

## 2014-08-23 ENCOUNTER — Other Ambulatory Visit: Payer: Self-pay | Admitting: Cardiology

## 2014-09-04 ENCOUNTER — Ambulatory Visit (INDEPENDENT_AMBULATORY_CARE_PROVIDER_SITE_OTHER): Payer: Commercial Managed Care - HMO | Admitting: *Deleted

## 2014-09-04 DIAGNOSIS — Z5181 Encounter for therapeutic drug level monitoring: Secondary | ICD-10-CM

## 2014-09-04 DIAGNOSIS — I482 Chronic atrial fibrillation, unspecified: Secondary | ICD-10-CM

## 2014-09-04 DIAGNOSIS — I4891 Unspecified atrial fibrillation: Secondary | ICD-10-CM

## 2014-09-04 DIAGNOSIS — Z7901 Long term (current) use of anticoagulants: Secondary | ICD-10-CM

## 2014-09-04 LAB — POCT INR: INR: 2.3

## 2014-10-11 ENCOUNTER — Ambulatory Visit (INDEPENDENT_AMBULATORY_CARE_PROVIDER_SITE_OTHER): Payer: Commercial Managed Care - HMO | Admitting: *Deleted

## 2014-10-11 DIAGNOSIS — I482 Chronic atrial fibrillation, unspecified: Secondary | ICD-10-CM

## 2014-10-11 DIAGNOSIS — I4891 Unspecified atrial fibrillation: Secondary | ICD-10-CM

## 2014-10-11 DIAGNOSIS — Z5181 Encounter for therapeutic drug level monitoring: Secondary | ICD-10-CM

## 2014-10-11 DIAGNOSIS — Z7901 Long term (current) use of anticoagulants: Secondary | ICD-10-CM

## 2014-10-11 LAB — POCT INR: INR: 2.5

## 2014-11-07 DIAGNOSIS — M810 Age-related osteoporosis without current pathological fracture: Secondary | ICD-10-CM | POA: Diagnosis not present

## 2014-11-07 DIAGNOSIS — E782 Mixed hyperlipidemia: Secondary | ICD-10-CM | POA: Diagnosis not present

## 2014-11-07 DIAGNOSIS — R7301 Impaired fasting glucose: Secondary | ICD-10-CM | POA: Diagnosis not present

## 2014-11-07 DIAGNOSIS — Z79899 Other long term (current) drug therapy: Secondary | ICD-10-CM | POA: Diagnosis not present

## 2014-11-07 DIAGNOSIS — I4891 Unspecified atrial fibrillation: Secondary | ICD-10-CM | POA: Diagnosis not present

## 2014-11-07 DIAGNOSIS — R8290 Unspecified abnormal findings in urine: Secondary | ICD-10-CM | POA: Diagnosis not present

## 2014-11-07 DIAGNOSIS — C50919 Malignant neoplasm of unspecified site of unspecified female breast: Secondary | ICD-10-CM | POA: Diagnosis not present

## 2014-11-07 DIAGNOSIS — Z0000 Encounter for general adult medical examination without abnormal findings: Secondary | ICD-10-CM | POA: Diagnosis not present

## 2014-11-07 DIAGNOSIS — Z86010 Personal history of colonic polyps: Secondary | ICD-10-CM | POA: Diagnosis not present

## 2014-11-21 ENCOUNTER — Ambulatory Visit (INDEPENDENT_AMBULATORY_CARE_PROVIDER_SITE_OTHER): Payer: Commercial Managed Care - HMO | Admitting: Pharmacist Clinician (PhC)/ Clinical Pharmacy Specialist

## 2014-11-21 DIAGNOSIS — I4891 Unspecified atrial fibrillation: Secondary | ICD-10-CM

## 2014-11-21 DIAGNOSIS — Z5181 Encounter for therapeutic drug level monitoring: Secondary | ICD-10-CM

## 2014-11-21 DIAGNOSIS — Z7901 Long term (current) use of anticoagulants: Secondary | ICD-10-CM | POA: Diagnosis not present

## 2014-11-21 DIAGNOSIS — I482 Chronic atrial fibrillation, unspecified: Secondary | ICD-10-CM

## 2014-11-21 LAB — POCT INR: INR: 2.1

## 2014-11-27 ENCOUNTER — Other Ambulatory Visit: Payer: Self-pay | Admitting: Cardiology

## 2014-12-03 DIAGNOSIS — M8580 Other specified disorders of bone density and structure, unspecified site: Secondary | ICD-10-CM | POA: Diagnosis not present

## 2014-12-19 DIAGNOSIS — H25813 Combined forms of age-related cataract, bilateral: Secondary | ICD-10-CM | POA: Diagnosis not present

## 2014-12-19 DIAGNOSIS — H3531 Nonexudative age-related macular degeneration: Secondary | ICD-10-CM | POA: Diagnosis not present

## 2014-12-25 ENCOUNTER — Ambulatory Visit (INDEPENDENT_AMBULATORY_CARE_PROVIDER_SITE_OTHER): Payer: Commercial Managed Care - HMO | Admitting: Cardiology

## 2014-12-25 ENCOUNTER — Ambulatory Visit (INDEPENDENT_AMBULATORY_CARE_PROVIDER_SITE_OTHER): Payer: Commercial Managed Care - HMO | Admitting: *Deleted

## 2014-12-25 ENCOUNTER — Encounter: Payer: Self-pay | Admitting: Cardiology

## 2014-12-25 VITALS — BP 132/80 | HR 110 | Ht 63.0 in | Wt 154.8 lb

## 2014-12-25 DIAGNOSIS — I482 Chronic atrial fibrillation, unspecified: Secondary | ICD-10-CM

## 2014-12-25 DIAGNOSIS — Z7901 Long term (current) use of anticoagulants: Secondary | ICD-10-CM

## 2014-12-25 DIAGNOSIS — Z5181 Encounter for therapeutic drug level monitoring: Secondary | ICD-10-CM

## 2014-12-25 DIAGNOSIS — I4891 Unspecified atrial fibrillation: Secondary | ICD-10-CM

## 2014-12-25 LAB — POCT INR: INR: 2

## 2014-12-25 NOTE — Progress Notes (Signed)
Cardiology Office Note   Date:  12/25/2014   ID:  Kathryn Chang, DOB July 13, 1941, MRN [id number]  PCP:  Gennette Pac, MD  Cardiologist:   Darlin Coco, MD   No chief complaint on file.     History of Present Illness: Kathryn Chang is a 74 y.o. female who presents for a four-month follow-up office visit  This pleasant 74 year old woman is seen for a scheduled four-month followup office visit. She has a history of established atrial fibrillation and is on long-term Coumadin. She had an echocardiogram on 10/29/10 showing normal LV systolic function with ejection fraction 55-60%. There was mild aortic regurgitation and mild mitral regurgitation and the pulmonary artery pressure was 35 Since last visit she's had no new cardiac symptoms. She has not been experiencing any excessive bruising or bleeding from the Coumadin. She denies chest pain or shortness of breath. He has not had any TIA symptoms.  At her last visit she was having a lot of problems with her feet.  These have cleared.  She has started to walk more now.  Despite this, her weight is up 9 pounds since last visit.  Past Medical History  Diagnosis Date  . Atrial fibrillation   . SBE (subacute bacterial endocarditis) prophylaxis candidate     for MR, AI    Past Surgical History  Procedure Laterality Date  . US echocardiography  10/2010    EF- 55-60%, mild  MR, mild AR  . Appendectomy    . Mastectomy  1987    With Reconstruction- Left     Current Outpatient Prescriptions  Medication Sig Dispense Refill  . Alendronate Sodium (FOSAMAX PO) Take by mouth once a week.      . Calcium Carbonate-Vitamin D (CALCIUM + D PO) Take 800 mg by mouth daily.     . Cholecalciferol (VITAMIN D PO) Take 2,000 Units by mouth daily.      . digoxin (LANOXIN) 0.25 MG tablet TAKE 1 TABLET EVERY DAY 90 tablet 3  . Docusate Calcium (STOOL SOFTENER PO) Take by mouth as needed.      . fish oil-omega-3 fatty acids 1000  MG capsule Take 1 g by mouth daily.      . Multiple Vitamin (MULTIVITAMIN PO) Take by mouth daily.      Marland Kitchen warfarin (COUMADIN) 5 MG tablet TAKE AS DIRECTED BY COUMADIN CLINIC 100 tablet 0   No current facility-administered medications for this visit.    Allergies:   Shellfish-derived products    Social History:  The patient  reports that she has never smoked. She does not have any smokeless tobacco history on file.   Family History:  The patient's family history includes Heart disease in her father; Hypertension in her father.    ROS:  Please see the history of present illness.   Otherwise, review of systems are positive for none.   All other systems are reviewed and negative.    PHYSICAL EXAM: VS:  BP 132/80 mmHg  Pulse 110  Ht 5\' 3"  (1.6 m)  Wt 154 lb 12.8 oz (70.217 kg)  BMI 27.43 kg/m2 , BMI Body mass index is 27.43 kg/(m^2). GEN: Well nourished, well developed, in no acute distress HEENT: normal Neck: no JVD, carotid bruits, or masses Cardiac: Irregularly irregular rhythm at heart rate of 110; no murmurs, rubs, or gallops,no edema  Respiratory:  clear to auscultation bilaterally, normal work of breathing GI: soft, nontender, nondistended, + BS MS: no deformity or atrophy Skin: warm and  dry, no rash Neuro:  Strength and sensation are intact Psych: euthymic mood, full affect   EKG:  EKG is ordered today. The ekg ordered today demonstrates atrial fibrillation with increased ventricular response of 110.  Since previous tracing of 11/21/13, heart rate is faster.   Recent Labs: No results found for requested labs within last 365 days.    Lipid Panel No results found for: CHOL, TRIG, HDL, CHOLHDL, VLDL, LDLCALC, LDLDIRECT    Wt Readings from Last 3 Encounters:  12/25/14 154 lb 12.8 oz (70.217 kg)  08/16/14 145 lb (65.772 kg)  04/01/14 143 lb (64.864 kg)       ASSESSMENT AND PLAN:  1.  Chronic permanent atrial fibrillation.  Ventricular response is faster today.   She is on digoxin 0.25 mg daily. 2. mitral regurgitation 3. left foot pain, improved   Current medicines are reviewed at length with the patient today.  The patient does not have concerns regarding medicines.  The following changes have been made:  no change  Labs/ tests ordered today include: 2-D echocardiogram   Orders Placed This Encounter  Procedures  . EKG 12-Lead  . 2D Echocardiogram without contrast     Disposition:   FU with Dr. Mare Ferrari in 4 months for follow-up office visit. We will get a 2-D echocardiogram to look at her LV function and her mitral regurgitation.  After those results are known, we will consider switching her to a different rate control medicine, either a beta blocker or a calcium channel blocker.  Long-term, she expressed some interest in possibly switching from warfarin to a novel oral anticoagulant agent and we can discuss this at her next visit.   Signed, Darlin Coco, MD  12/25/2014 3:58 PM    [facility] [place], [place], [place]  [address] Phone: [phone]; Fax: [phone]

## 2014-12-25 NOTE — Patient Instructions (Signed)
Your physician recommends that you continue on your current medications as directed. Please refer to the Current Medication list given to you today.  Your physician has requested that you have an echocardiogram. Echocardiography is a painless test that uses sound waves to create images of your heart. It provides your doctor with information about the size and shape of your heart and how well your heart's chambers and valves are working. This procedure takes approximately one hour. There are no restrictions for this procedure.  Your physician wants you to follow-up in: 4 month ov  You will receive a reminder letter in the mail two months in advance. If you don't receive a letter, please call our office to schedule the follow-up appointment.

## 2014-12-30 ENCOUNTER — Other Ambulatory Visit (HOSPITAL_COMMUNITY): Payer: Commercial Managed Care - HMO

## 2015-01-01 ENCOUNTER — Ambulatory Visit (HOSPITAL_COMMUNITY): Payer: Commercial Managed Care - HMO | Attending: Cardiovascular Disease

## 2015-01-01 DIAGNOSIS — I482 Chronic atrial fibrillation, unspecified: Secondary | ICD-10-CM

## 2015-01-01 NOTE — Progress Notes (Signed)
2D Echo completed. 01/01/2015 

## 2015-01-06 ENCOUNTER — Telehealth: Payer: Self-pay | Admitting: Cardiology

## 2015-01-06 MED ORDER — METOPROLOL SUCCINATE ER 25 MG PO TB24: 25.0000 mg | ORAL_TABLET | Freq: Every day | ORAL | Status: DC

## 2015-01-06 NOTE — Telephone Encounter (Signed)
Called patient back about echo results and medication changes. Given information as below. Patient verbalized understanding.   Notes Recorded by Darlin Coco, MD on 01/02/2015 at 8:17 AM Please report. The left ventricular function is still good. The aortic regurgitation has increased since 2012. Her heart rate was running faster when we saw her last visit. The pulmonary artery pressure is slightly higher this time. I would like to have her stop digoxin at this point and start Toprol-XL 25 one daily for rate control instead.

## 2015-01-06 NOTE — Telephone Encounter (Signed)
New message ° ° ° ° ° °Want echo results °

## 2015-02-05 ENCOUNTER — Ambulatory Visit (INDEPENDENT_AMBULATORY_CARE_PROVIDER_SITE_OTHER): Payer: Commercial Managed Care - HMO | Admitting: *Deleted

## 2015-02-05 DIAGNOSIS — I4891 Unspecified atrial fibrillation: Secondary | ICD-10-CM | POA: Diagnosis not present

## 2015-02-05 DIAGNOSIS — I482 Chronic atrial fibrillation, unspecified: Secondary | ICD-10-CM

## 2015-02-05 DIAGNOSIS — Z7901 Long term (current) use of anticoagulants: Secondary | ICD-10-CM | POA: Diagnosis not present

## 2015-02-05 DIAGNOSIS — Z5181 Encounter for therapeutic drug level monitoring: Secondary | ICD-10-CM

## 2015-02-05 LAB — POCT INR: INR: 2.8

## 2015-03-18 ENCOUNTER — Ambulatory Visit (INDEPENDENT_AMBULATORY_CARE_PROVIDER_SITE_OTHER): Payer: Commercial Managed Care - HMO | Admitting: *Deleted

## 2015-03-18 DIAGNOSIS — I4891 Unspecified atrial fibrillation: Secondary | ICD-10-CM | POA: Diagnosis not present

## 2015-03-18 DIAGNOSIS — I482 Chronic atrial fibrillation, unspecified: Secondary | ICD-10-CM

## 2015-03-18 DIAGNOSIS — Z7901 Long term (current) use of anticoagulants: Secondary | ICD-10-CM

## 2015-03-18 DIAGNOSIS — Z5181 Encounter for therapeutic drug level monitoring: Secondary | ICD-10-CM

## 2015-03-18 LAB — POCT INR: INR: 2.8

## 2015-05-05 ENCOUNTER — Encounter: Payer: Self-pay | Admitting: Cardiology

## 2015-05-05 ENCOUNTER — Ambulatory Visit (INDEPENDENT_AMBULATORY_CARE_PROVIDER_SITE_OTHER): Payer: Commercial Managed Care - HMO | Admitting: Cardiology

## 2015-05-05 ENCOUNTER — Ambulatory Visit (INDEPENDENT_AMBULATORY_CARE_PROVIDER_SITE_OTHER): Payer: Commercial Managed Care - HMO

## 2015-05-05 VITALS — BP 126/74 | HR 74 | Ht 63.0 in | Wt 159.8 lb

## 2015-05-05 DIAGNOSIS — I482 Chronic atrial fibrillation, unspecified: Secondary | ICD-10-CM

## 2015-05-05 DIAGNOSIS — I340 Nonrheumatic mitral (valve) insufficiency: Secondary | ICD-10-CM | POA: Diagnosis not present

## 2015-05-05 DIAGNOSIS — Z7901 Long term (current) use of anticoagulants: Secondary | ICD-10-CM | POA: Diagnosis not present

## 2015-05-05 DIAGNOSIS — I351 Nonrheumatic aortic (valve) insufficiency: Secondary | ICD-10-CM

## 2015-05-05 DIAGNOSIS — I4891 Unspecified atrial fibrillation: Secondary | ICD-10-CM

## 2015-05-05 DIAGNOSIS — Z5181 Encounter for therapeutic drug level monitoring: Secondary | ICD-10-CM

## 2015-05-05 LAB — POCT INR: INR: 2.5

## 2015-05-05 NOTE — Progress Notes (Signed)
Cardiology Office Note   Date:  05/05/2015   ID:  Kathryn Chang, DOB Nov 03, 1940, MRN [id number]  PCP:  Gennette Pac, MD  Cardiologist: Darlin Coco MD  No chief complaint on file.     History of Present Illness: Kathryn Chang is a 74 y.o. female who presents for a four-month follow-up office visit  This pleasant 74 year old woman is seen for a scheduled four-month followup office visit. She has a history of established atrial fibrillation and is on long-term Coumadin. The patient had an echocardiogram on 01/01/15 which showed normal left ventricular systolic function with an ejection fraction of 85-60%.  There was moderate aortic insufficiency and there was biatrial enlargement.  She was switched from digoxin to metoprolol for rate control. Since last visit she's had no new cardiac symptoms. She has not been experiencing any excessive bruising or bleeding from the Coumadin. She denies chest pain or shortness of breath. He has not had any TIA symptoms. She sleeps on one pillow.  She is not having any peripheral edema.  She denies any shortness of breath when she  Walks. 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  She has been there once before.  Past Medical History  Diagnosis Date  . Atrial fibrillation   . SBE (subacute bacterial endocarditis) prophylaxis candidate     for MR, AI    Past Surgical History  Procedure Laterality Date  . US echocardiography  10/2010    EF- 55-60%, mild  MR, mild AR  . Appendectomy    . Mastectomy  1987    With Reconstruction- Left     Current Outpatient Prescriptions  Medication Sig Dispense Refill  . Alendronate Sodium (FOSAMAX PO) Take by mouth once a week.      . Calcium Carbonate-Vitamin D (CALCIUM + D PO) Take 800 mg by mouth 2 (two) times daily.     . Cholecalciferol (VITAMIN D PO) Take 2,000 Units by mouth daily.      [name] Calcium (STOOL SOFTENER PO) Take by  mouth as needed.      . fish oil-omega-3 fatty acids 1000 MG capsule Take 1 g by mouth daily.      . metoprolol succinate (TOPROL XL) 25 MG 24 hr tablet Take 1 tablet (25 mg total) by mouth daily. 90 tablet 3  . Multiple Vitamin (MULTIVITAMIN PO) Take by mouth daily.      Marland Kitchen warfarin (COUMADIN) 5 MG tablet TAKE AS DIRECTED BY COUMADIN CLINIC 100 tablet 0   No current facility-administered medications for this visit.    Allergies:   Shellfish-derived products    Social History:  The patient  reports that she has never smoked. She does not have any smokeless tobacco history on file.   Family History:  The patient's family history includes Heart disease in her father and mother; Hypertension in her father and mother.    ROS:  Please see the history of present illness.   Otherwise, review of systems are positive for none.   All other systems are reviewed and negative.    PHYSICAL EXAM: VS:  BP 126/74 mmHg  Pulse 74  Ht 5\' 3"  (1.6 m)  Wt 159 lb 12.8 oz (72.485 kg)  BMI 28.31 kg/m2  SpO2 95% , BMI Body mass index is 28.31 kg/(m^2). GEN: Well nourished, well developed, in no acute distress HEENT: normal Neck: no JVD, carotid bruits, or masses Cardiac: Irregularly irregular rhythm at 74  bpm.  Soft aortic insufficiency murmur.  Soft apical systolic murmur.  No gallop or rub.  No peripheral edema. Respiratory:  clear to auscultation bilaterally, normal work of breathing GI: soft, nontender, nondistended, + BS MS: no deformity or atrophy Skin: warm and dry, no rash Neuro:  Strength and sensation are intact Psych: euthymic mood, full affect   EKG:  EKG is not ordered today.    Recent Labs: No results found for requested labs within last 365 days.    Lipid Panel No results found for: CHOL, TRIG, HDL, CHOLHDL, VLDL, LDLCALC, LDLDIRECT    Wt Readings from Last 3 Encounters:  05/05/15 159 lb 12.8 oz (72.485 kg)  12/25/14 154 lb 12.8 oz (70.217 kg)  08/16/14 145 lb (65.772 kg)          ASSESSMENT AND PLAN:  1.  Chronic permanent atrial fibrillation with controlled ventricular response.  Biatrial enlargement on echo. 2.  Moderate aortic insufficiency by echocardiogram. 3.  Trivial mitral regurgitation by echo 4.  Weight gain  Disposition: She will continue her same medication.  She remains on warfarin at this point.  At her next visit we will discuss alternatives to warfarin.  We do not want to change right now before she takes her trip overseas.  Continue current medication and recheck in 4 months for office visit and EKG. In regard to her weight gain, she will work harder on avoiding carbohydrates and bread in particular She will be having her annual physical next winter with Dr. [name] and she gets lab work at that time.  Current medicines are reviewed at length with the patient today.  The patient does not have concerns regarding medicines.  The following changes have been made:  no change  Labs/ tests ordered today include:  No orders of the defined types were placed in this encounter.       Berna Spare MD 05/05/2015 1:37 PM    [facility] [place], [place], [place]  [address] Phone: [phone]; Fax: [phone]

## 2015-05-05 NOTE — Patient Instructions (Signed)
Medication Instructions:  Your physician recommends that you continue on your current medications as directed. Please refer to the Current Medication list given to you today.  Labwork: NONE  Testing/Procedures: NONE  Follow-Up: Your physician wants you to follow-up in: [place] will receive a reminder letter in the mail two months in advance. If you don't receive a letter, please call our office to schedule the follow-up appointment.

## 2015-05-22 ENCOUNTER — Other Ambulatory Visit: Payer: Self-pay | Admitting: Cardiology

## 2015-05-22 NOTE — Telephone Encounter (Signed)
Coumadin refill. Thank you for your time. 

## 2015-06-05 DIAGNOSIS — I4891 Unspecified atrial fibrillation: Secondary | ICD-10-CM | POA: Diagnosis not present

## 2015-06-05 DIAGNOSIS — R05 Cough: Secondary | ICD-10-CM | POA: Diagnosis not present

## 2015-06-09 ENCOUNTER — Ambulatory Visit (INDEPENDENT_AMBULATORY_CARE_PROVIDER_SITE_OTHER): Payer: Commercial Managed Care - HMO

## 2015-06-09 DIAGNOSIS — I4891 Unspecified atrial fibrillation: Secondary | ICD-10-CM | POA: Diagnosis not present

## 2015-06-09 DIAGNOSIS — Z5181 Encounter for therapeutic drug level monitoring: Secondary | ICD-10-CM | POA: Diagnosis not present

## 2015-06-09 DIAGNOSIS — Z7901 Long term (current) use of anticoagulants: Secondary | ICD-10-CM | POA: Diagnosis not present

## 2015-06-09 LAB — POCT INR: INR: 6.6

## 2015-06-09 LAB — PROTIME-INR
INR: 6.3 ratio — AB (ref 0.8–1.0)
PROTHROMBIN TIME: 66.7 s — AB (ref 9.6–13.1)

## 2015-06-16 DIAGNOSIS — J9801 Acute bronchospasm: Secondary | ICD-10-CM | POA: Diagnosis not present

## 2015-06-17 ENCOUNTER — Ambulatory Visit (INDEPENDENT_AMBULATORY_CARE_PROVIDER_SITE_OTHER): Payer: Commercial Managed Care - HMO | Admitting: *Deleted

## 2015-06-17 DIAGNOSIS — Z7901 Long term (current) use of anticoagulants: Secondary | ICD-10-CM | POA: Diagnosis not present

## 2015-06-17 DIAGNOSIS — I4891 Unspecified atrial fibrillation: Secondary | ICD-10-CM

## 2015-06-17 DIAGNOSIS — Z5181 Encounter for therapeutic drug level monitoring: Secondary | ICD-10-CM

## 2015-06-17 LAB — POCT INR: INR: 2

## 2015-07-03 ENCOUNTER — Ambulatory Visit (INDEPENDENT_AMBULATORY_CARE_PROVIDER_SITE_OTHER): Payer: Commercial Managed Care - HMO

## 2015-07-03 DIAGNOSIS — I4891 Unspecified atrial fibrillation: Secondary | ICD-10-CM | POA: Diagnosis not present

## 2015-07-03 DIAGNOSIS — Z5181 Encounter for therapeutic drug level monitoring: Secondary | ICD-10-CM | POA: Diagnosis not present

## 2015-07-03 DIAGNOSIS — Z7901 Long term (current) use of anticoagulants: Secondary | ICD-10-CM | POA: Diagnosis not present

## 2015-07-03 LAB — POCT INR: INR: 2.4

## 2015-07-09 DIAGNOSIS — Z23 Encounter for immunization: Secondary | ICD-10-CM | POA: Diagnosis not present

## 2015-07-24 ENCOUNTER — Ambulatory Visit (INDEPENDENT_AMBULATORY_CARE_PROVIDER_SITE_OTHER): Payer: Commercial Managed Care - HMO

## 2015-07-24 DIAGNOSIS — Z5181 Encounter for therapeutic drug level monitoring: Secondary | ICD-10-CM

## 2015-07-24 DIAGNOSIS — I4891 Unspecified atrial fibrillation: Secondary | ICD-10-CM

## 2015-07-24 DIAGNOSIS — Z7901 Long term (current) use of anticoagulants: Secondary | ICD-10-CM

## 2015-07-24 LAB — POCT INR: INR: 1.5

## 2015-08-07 ENCOUNTER — Ambulatory Visit (INDEPENDENT_AMBULATORY_CARE_PROVIDER_SITE_OTHER): Payer: Commercial Managed Care - HMO | Admitting: *Deleted

## 2015-08-07 DIAGNOSIS — I4891 Unspecified atrial fibrillation: Secondary | ICD-10-CM | POA: Diagnosis not present

## 2015-08-07 DIAGNOSIS — Z7901 Long term (current) use of anticoagulants: Secondary | ICD-10-CM

## 2015-08-07 DIAGNOSIS — Z5181 Encounter for therapeutic drug level monitoring: Secondary | ICD-10-CM | POA: Diagnosis not present

## 2015-08-07 LAB — POCT INR: INR: 2.4

## 2015-08-28 ENCOUNTER — Ambulatory Visit (INDEPENDENT_AMBULATORY_CARE_PROVIDER_SITE_OTHER): Payer: Commercial Managed Care - HMO | Admitting: *Deleted

## 2015-08-28 DIAGNOSIS — Z7901 Long term (current) use of anticoagulants: Secondary | ICD-10-CM

## 2015-08-28 DIAGNOSIS — Z5181 Encounter for therapeutic drug level monitoring: Secondary | ICD-10-CM

## 2015-08-28 DIAGNOSIS — I4891 Unspecified atrial fibrillation: Secondary | ICD-10-CM | POA: Diagnosis not present

## 2015-08-28 LAB — POCT INR: INR: 2.3

## 2015-09-10 ENCOUNTER — Encounter: Payer: Self-pay | Admitting: Cardiology

## 2015-09-10 ENCOUNTER — Ambulatory Visit (INDEPENDENT_AMBULATORY_CARE_PROVIDER_SITE_OTHER): Payer: Commercial Managed Care - HMO | Admitting: Cardiology

## 2015-09-10 ENCOUNTER — Ambulatory Visit (INDEPENDENT_AMBULATORY_CARE_PROVIDER_SITE_OTHER): Payer: Commercial Managed Care - HMO | Admitting: *Deleted

## 2015-09-10 VITALS — BP 118/60 | HR 82 | Ht 63.0 in | Wt 167.8 lb

## 2015-09-10 DIAGNOSIS — I4891 Unspecified atrial fibrillation: Secondary | ICD-10-CM

## 2015-09-10 DIAGNOSIS — Z7901 Long term (current) use of anticoagulants: Secondary | ICD-10-CM | POA: Diagnosis not present

## 2015-09-10 DIAGNOSIS — Z5181 Encounter for therapeutic drug level monitoring: Secondary | ICD-10-CM

## 2015-09-10 LAB — BASIC METABOLIC PANEL
BUN: 27 mg/dL — ABNORMAL HIGH (ref 7–25)
CO2: 23 mmol/L (ref 20–31)
Calcium: 9.2 mg/dL (ref 8.6–10.4)
Chloride: 99 mmol/L (ref 98–110)
Creat: 0.92 mg/dL (ref 0.60–0.93)
GLUCOSE: 92 mg/dL (ref 65–99)
POTASSIUM: 4.2 mmol/L (ref 3.5–5.3)
SODIUM: 137 mmol/L (ref 135–146)

## 2015-09-10 LAB — POCT INR: INR: 2

## 2015-09-10 MED ORDER — APIXABAN 5 MG PO TABS: 5.0000 mg | ORAL_TABLET | Freq: Two times a day (BID) | ORAL | Status: DC

## 2015-09-10 NOTE — Patient Instructions (Signed)
Medication Instructions:  STOP WARFARIN   START ELIQUIS  5 MG TWICE A DAY   Labwork: BMET  Testing/Procedures: NONE  Follow-Up: Your physician recommends that you schedule a follow-up appointment in: [place] wants you to follow-up in: [place] will receive a reminder letter in the mail two months in advance. If you don't receive a letter, please call our office to schedule the follow-up appointment.  If you need a refill on your cardiac medications before your next appointment, please call your pharmacy.

## 2015-09-10 NOTE — Progress Notes (Signed)
Cardiology Office Note   Date:  09/10/2015   ID:  CAIRI SOBOTKA, DOB [birth year], MRN [id number]  PCP:  Gennette Pac, MD  Cardiologist: Darlin Coco MD  Chief Complaint  Patient presents with  . Atrial Fibrillation      History of Present Illness: Kathryn Chang is a 74 y.o. female who presents for scheduled follow-up visit  This pleasant 74 year old woman is seen for a scheduled four-month followup office visit. She has a history of established atrial fibrillation and is on long-term Coumadin. The patient had an echocardiogram on 01/01/15 which showed normal left ventricular systolic function with an ejection fraction of 85-60%. There was moderate aortic insufficiency and there was biatrial enlargement. She was switched from digoxin to metoprolol for rate control. Since last visit she's had no new cardiac symptoms. She has not been experiencing any excessive bruising or bleeding from the Coumadin. She denies chest pain or shortness of breath. He has not had any TIA symptoms. She sleeps on one pillow. She is not having any peripheral edema. She denies any shortness of breath when she walks. At her last visit we talked about switching from warfarin to one of the NOACs but did not do it at that time because she was leaving for [place] 2 days later.  She had a nice trip to [place] and they were able to administer to a lot of the local people there.  Past Medical History  Diagnosis Date  . Atrial fibrillation ([place])   . SBE (subacute bacterial endocarditis) prophylaxis candidate     for MR, AI    Past Surgical History  Procedure Laterality Date  . US echocardiography  10/2010    EF- 55-60%, mild  MR, mild AR  . Appendectomy    . Mastectomy  1987    With Reconstruction- Left     Current Outpatient Prescriptions  Medication Sig Dispense Refill  . Alendronate Sodium (FOSAMAX PO) Take 1 tablet by mouth once a week.     . Calcium  Carbonate-Vitamin D (CALCIUM + D PO) Take 800 mg by mouth 2 (two) times daily.     . Cholecalciferol (VITAMIN D PO) Take 2,000 Units by mouth daily.      [name] Calcium (STOOL SOFTENER PO) Take 1 tablet by mouth as needed.     . fish oil-omega-3 fatty acids 1000 MG capsule Take 1 g by mouth daily.      . metoprolol succinate (TOPROL XL) 25 MG 24 hr tablet Take 1 tablet (25 mg total) by mouth daily. 90 tablet 3  . Multiple Vitamin (MULTIVITAMIN PO) Take 1 tablet by mouth daily.     Marland Kitchen apixaban (ELIQUIS) 5 MG TABS tablet Take 1 tablet (5 mg total) by mouth 2 (two) times daily. 60 tablet 5   No current facility-administered medications for this visit.    Allergies:   Shellfish-derived products    Social History:  The patient  reports that she has never smoked. She does not have any smokeless tobacco history on file.   Family History:  The patient's family history includes Heart disease in her father and mother; Hypertension in her father and mother.    ROS:  Please see the history of present illness.   Otherwise, review of systems are positive for none.   All other systems are reviewed and negative.    PHYSICAL EXAM: VS:  BP 118/60 mmHg  Pulse 82  Ht 5\' 3"  (1.6 m)  Wt 167 lb 12.8 oz (  76.114 kg)  BMI 29.73 kg/m2 , BMI Body mass index is 29.73 kg/(m^2). GEN: Well nourished, well developed, in no acute distress HEENT: normal Neck: no JVD, carotid bruits, or masses Cardiac: Irregularly irregular.  Faint murmur of aortic insufficiency.  No gallop., rubs, or ,no edema  Respiratory:  clear to auscultation bilaterally, normal work of breathing GI: soft, nontender, nondistended, + BS MS: no deformity or atrophy Skin: warm and dry, no rash Neuro:  Strength and sensation are intact Psych: euthymic mood, full affect   EKG:  EKG is not ordered today.    Recent Labs: No results found for requested labs within last 365 days.    Lipid Panel No results found for: CHOL, TRIG, HDL,  CHOLHDL, VLDL, LDLCALC, LDLDIRECT    Wt Readings from Last 3 Encounters:  09/10/15 167 lb 12.8 oz (76.114 kg)  05/05/15 159 lb 12.8 oz (72.485 kg)  12/25/14 154 lb 12.8 oz (70.217 kg)         ASSESSMENT AND PLAN:  1. Chronic permanent atrial fibrillation with controlled ventricular response. Biatrial enlargement on echo. 2. Moderate aortic insufficiency by echocardiogram. 3. Trivial mitral regurgitation by echo 4. Weight gain   Current medicines are reviewed at length with the patient today.  The patient does not have concerns regarding medicines.  The following changes have been made:  We are switching her from warfarin to Apixaban 5 mg twice a day.  Her INR today was 2.0  Labs/ tests ordered today include: We are checking a basal metabolic panel today   Orders Placed This Encounter  Procedures  . Basic metabolic panel     Disposition:   The patient will return in about a month's to talk with the anticoagulation clinic about the Apixaban and answer any questions etc. in 6 months she will return for an office visit and EKG with Dr. [name]  Signed, Darlin Coco MD 09/10/2015 5:50 PM    [facility], [place], [place]  [address] Phone: [phone]; Fax: [phone]

## 2015-09-11 ENCOUNTER — Telehealth: Payer: Self-pay | Admitting: Cardiology

## 2015-09-11 NOTE — Telephone Encounter (Signed)
Advised patient of lab results  

## 2015-09-11 NOTE — Telephone Encounter (Signed)
-----   Message from Darlin Coco, MD sent at 09/11/2015  7:54 AM EST ----- Please report.  The kidney function is satisfactory for her to be on the current dose of Apixaban.

## 2015-09-11 NOTE — Telephone Encounter (Signed)
F/u   Pt returning Melinda's phone call  

## 2015-09-30 DIAGNOSIS — Z853 Personal history of malignant neoplasm of breast: Secondary | ICD-10-CM | POA: Diagnosis not present

## 2015-09-30 DIAGNOSIS — Z1231 Encounter for screening mammogram for malignant neoplasm of breast: Secondary | ICD-10-CM | POA: Diagnosis not present

## 2015-10-07 ENCOUNTER — Ambulatory Visit (INDEPENDENT_AMBULATORY_CARE_PROVIDER_SITE_OTHER): Payer: Commercial Managed Care - HMO | Admitting: *Deleted

## 2015-10-07 DIAGNOSIS — I4891 Unspecified atrial fibrillation: Secondary | ICD-10-CM | POA: Diagnosis not present

## 2015-10-07 DIAGNOSIS — Z7901 Long term (current) use of anticoagulants: Secondary | ICD-10-CM

## 2015-10-07 LAB — CBC
HEMATOCRIT: 41.6 % (ref 36.0–46.0)
HEMOGLOBIN: 13.9 g/dL (ref 12.0–15.0)
MCH: 30.1 pg (ref 26.0–34.0)
MCHC: 33.4 g/dL (ref 30.0–36.0)
MCV: 90 fL (ref 78.0–100.0)
MPV: 11.7 fL (ref 8.6–12.4)
Platelets: 276 10*3/uL (ref 150–400)
RBC: 4.62 MIL/uL (ref 3.87–5.11)
RDW: 14.3 % (ref 11.5–15.5)
WBC: 5.7 10*3/uL (ref 4.0–10.5)

## 2015-10-07 LAB — BASIC METABOLIC PANEL
BUN: 26 mg/dL — AB (ref 7–25)
CALCIUM: 9.5 mg/dL (ref 8.6–10.4)
CO2: 28 mmol/L (ref 20–31)
Chloride: 102 mmol/L (ref 98–110)
Creat: 0.85 mg/dL (ref 0.60–0.93)
GLUCOSE: 90 mg/dL (ref 65–99)
Potassium: 4.3 mmol/L (ref 3.5–5.3)
Sodium: 139 mmol/L (ref 135–146)

## 2015-10-07 NOTE — Progress Notes (Signed)
Pt was started on Eliquis 5mg  twice a day for Afib on 09/10/15 by  Dr. Mare Ferrari.      Reviewed patients medication list.  Pt is not currently on any combined P-gp and strong CYP3A4 inhibitors/inducers (ketoconazole, traconazole, ritonavir, carbamazepine, phenytoin, rifampin, St. John's wort).  Reviewed labs:  SCr-0.85, Weight-73.9 kg, Hgb-13.9, HCT- 41.6.  Dose appropriate based on dosing criteria.   Hgb and HCT Within Normal Limits.    A full discussion of the nature of anticoagulants has been carried out.  A benefit/risk analysis has been presented to the patient, so that they understand the justification for choosing anticoagulation with Eliquis at this time.  The need for compliance is stressed.  Pt is aware to take the medication twice daily.  Side effects of potential bleeding are discussed, including unusual colored urine or stools, coughing up blood or coffee ground emesis, nose bleeds or serious fall or head trauma.  Discussed signs and symptoms of stroke. The patient should avoid any OTC items containing aspirin or ibuprofen.  Avoid alcohol consumption.   Call if any signs of abnormal bleeding.  Discussed financial obligations and resolved any difficulty in obtaining medication.    10/08/15 Spoke with patient and advised her of the lab results.  Instructed her to continue taking Eliquis 5mg  twice a day and to call back with any issues and she verbalized understanding.  Appointment set for 6 months for follow-up.

## 2015-10-08 MED ORDER — APIXABAN 5 MG PO TABS: 5.0000 mg | ORAL_TABLET | Freq: Two times a day (BID) | ORAL | Status: DC

## 2015-10-08 NOTE — Progress Notes (Signed)
Quick Note:  Please report to patient. The recent labs are stable. Continue same medication and careful diet. ______ 

## 2015-11-13 DIAGNOSIS — Z853 Personal history of malignant neoplasm of breast: Secondary | ICD-10-CM | POA: Diagnosis not present

## 2015-11-13 DIAGNOSIS — R7301 Impaired fasting glucose: Secondary | ICD-10-CM | POA: Diagnosis not present

## 2015-11-13 DIAGNOSIS — I4891 Unspecified atrial fibrillation: Secondary | ICD-10-CM | POA: Diagnosis not present

## 2015-11-13 DIAGNOSIS — I358 Other nonrheumatic aortic valve disorders: Secondary | ICD-10-CM | POA: Diagnosis not present

## 2015-11-13 DIAGNOSIS — M810 Age-related osteoporosis without current pathological fracture: Secondary | ICD-10-CM | POA: Diagnosis not present

## 2015-11-13 DIAGNOSIS — Z0000 Encounter for general adult medical examination without abnormal findings: Secondary | ICD-10-CM | POA: Diagnosis not present

## 2015-11-13 DIAGNOSIS — E782 Mixed hyperlipidemia: Secondary | ICD-10-CM | POA: Diagnosis not present

## 2015-11-13 DIAGNOSIS — Z79899 Other long term (current) drug therapy: Secondary | ICD-10-CM | POA: Diagnosis not present

## 2015-11-13 DIAGNOSIS — Z86010 Personal history of colonic polyps: Secondary | ICD-10-CM | POA: Diagnosis not present

## 2015-12-03 ENCOUNTER — Other Ambulatory Visit: Payer: Self-pay | Admitting: Cardiology

## 2015-12-18 DIAGNOSIS — M79675 Pain in left toe(s): Secondary | ICD-10-CM | POA: Diagnosis not present

## 2016-01-01 ENCOUNTER — Ambulatory Visit: Payer: Self-pay | Admitting: Pharmacist

## 2016-01-01 DIAGNOSIS — I4891 Unspecified atrial fibrillation: Secondary | ICD-10-CM

## 2016-01-01 DIAGNOSIS — Z5181 Encounter for therapeutic drug level monitoring: Secondary | ICD-10-CM

## 2016-01-02 ENCOUNTER — Ambulatory Visit (INDEPENDENT_AMBULATORY_CARE_PROVIDER_SITE_OTHER): Payer: Commercial Managed Care - HMO | Admitting: Podiatry

## 2016-01-02 ENCOUNTER — Encounter: Payer: Self-pay | Admitting: Podiatry

## 2016-01-02 VITALS — BP 130/70 | HR 97 | Resp 16 | Ht 63.0 in | Wt 160.0 lb

## 2016-01-02 DIAGNOSIS — L600 Ingrowing nail: Secondary | ICD-10-CM

## 2016-01-02 NOTE — Progress Notes (Signed)
   Subjective:    Patient ID: Kathryn Chang, female    DOB: 02-Dec-1940, 75 y.o.   MRN: [id number]  HPI Patient presents with a nail problem on their Left foot; great toe-medial. Pt stated, "hurts to put pressure on foot"; x3 months.   Review of Systems  Musculoskeletal: Positive for arthralgias.  Allergic/Immunologic: Positive for food allergies.  All other systems reviewed and are negative.      Objective:   Physical Exam        Assessment & Plan:

## 2016-01-02 NOTE — Patient Instructions (Signed)

## 2016-01-03 NOTE — Progress Notes (Signed)
Subjective:     Patient ID: Kathryn Chang, female   DOB: Sep 21, 1941, 75 y.o.   MRN: [id number]  HPI patient states I been having trouble with his nail for at least 3 months and it makes it hard for me to wear shoe gear comfortably. I've tried to soak it and trim it   Review of Systems  All other systems reviewed and are negative.      Objective:   Physical Exam  Constitutional: She is oriented to person, place, and time.  Cardiovascular: Intact distal pulses.   Musculoskeletal: Normal range of motion.  Neurological: She is oriented to person, place, and time.  Skin: Skin is warm.  Nursing note and vitals reviewed.  neurovascular status intact muscle strength adequate range of motion within normal limits with patient found to have incurvated medial border left hallux that's very painful when pressed with distal redness but no active drainage noted. Patient's found to have good digital perfusion and is well oriented 3     Assessment:     Ingrown toenail deformity left hallux medial border with pain    Plan:     H&P condition reviewed with patient and discussed treatment options. She wants it fixed and I explained risk and today I infiltrated 60 mg Xylocaine Marcaine mixture did sterile prep to the left big toe and then utilizing sterile instrumentation remove the medial border exposing the root and applying phenol 3 applications 30 seconds followed by alcohol lavage and sterile dressing. Gave instructions on soaks and reappoint

## 2016-01-06 DIAGNOSIS — L570 Actinic keratosis: Secondary | ICD-10-CM | POA: Diagnosis not present

## 2016-01-06 DIAGNOSIS — D1801 Hemangioma of skin and subcutaneous tissue: Secondary | ICD-10-CM | POA: Diagnosis not present

## 2016-01-06 DIAGNOSIS — L814 Other melanin hyperpigmentation: Secondary | ICD-10-CM | POA: Diagnosis not present

## 2016-01-06 DIAGNOSIS — L821 Other seborrheic keratosis: Secondary | ICD-10-CM | POA: Diagnosis not present

## 2016-01-06 DIAGNOSIS — D225 Melanocytic nevi of trunk: Secondary | ICD-10-CM | POA: Diagnosis not present

## 2016-01-19 ENCOUNTER — Telehealth: Payer: Self-pay | Admitting: *Deleted

## 2016-01-19 NOTE — Telephone Encounter (Signed)
Left message for patient at [phone] (Home #) to check to see how they were doing from their ingrown toenail procedure that was performed on Friday, March 10. 2017. Waiting for a response.

## 2016-02-17 DIAGNOSIS — H353131 Nonexudative age-related macular degeneration, bilateral, early dry stage: Secondary | ICD-10-CM | POA: Diagnosis not present

## 2016-02-17 DIAGNOSIS — H25813 Combined forms of age-related cataract, bilateral: Secondary | ICD-10-CM | POA: Diagnosis not present

## 2016-02-26 DIAGNOSIS — M79672 Pain in left foot: Secondary | ICD-10-CM | POA: Diagnosis not present

## 2016-02-26 DIAGNOSIS — M79671 Pain in right foot: Secondary | ICD-10-CM | POA: Diagnosis not present

## 2016-03-01 ENCOUNTER — Encounter: Payer: Self-pay | Admitting: *Deleted

## 2016-03-03 ENCOUNTER — Ambulatory Visit (INDEPENDENT_AMBULATORY_CARE_PROVIDER_SITE_OTHER): Payer: Commercial Managed Care - HMO | Admitting: Cardiology

## 2016-03-03 ENCOUNTER — Encounter: Payer: Self-pay | Admitting: Cardiology

## 2016-03-03 VITALS — BP 122/82 | HR 89 | Ht 63.0 in | Wt 163.0 lb

## 2016-03-03 DIAGNOSIS — I481 Persistent atrial fibrillation: Secondary | ICD-10-CM | POA: Diagnosis not present

## 2016-03-03 DIAGNOSIS — I482 Chronic atrial fibrillation, unspecified: Secondary | ICD-10-CM

## 2016-03-03 DIAGNOSIS — I351 Nonrheumatic aortic (valve) insufficiency: Secondary | ICD-10-CM

## 2016-03-03 DIAGNOSIS — Z7901 Long term (current) use of anticoagulants: Secondary | ICD-10-CM

## 2016-03-03 DIAGNOSIS — I4821 Permanent atrial fibrillation: Secondary | ICD-10-CM | POA: Insufficient documentation

## 2016-03-03 DIAGNOSIS — I119 Hypertensive heart disease without heart failure: Secondary | ICD-10-CM

## 2016-03-03 DIAGNOSIS — I4819 Other persistent atrial fibrillation: Secondary | ICD-10-CM

## 2016-03-03 NOTE — Progress Notes (Signed)
Cardiology Office Note   Date:  03/03/2016   ID:  SHARRYL BOSH, DOB Dec 04, 1940, MRN [id number]  PCP:  Gennette Pac, MD  Cardiologist: Darlin Coco MD --> Dorothy Spark   Chief Complaint  Patient presents with  . Follow-up    History of Present Illness: Kathryn Chang is a 75 y.o. female who presents for scheduled follow-up visit  This pleasant 75 year old woman is seen for a scheduled four-month followup office visit. She has a history of established atrial fibrillation and is on long-term Coumadin. The patient had an echocardiogram on 01/01/15 which showed normal left ventricular systolic function with an ejection fraction of 85-60%. There was moderate aortic insufficiency and there was biatrial enlargement. She was switched from digoxin to metoprolol for rate control. Since last visit she's had no new cardiac symptoms. She has not been experiencing any excessive bruising or bleeding from the Coumadin. She denies chest pain or shortness of breath. He has not had any TIA symptoms. She sleeps on one pillow. She is not having any peripheral edema. She denies any shortness of breath when she walks. At her last visit we talked about switching from warfarin to one of the NOACs but did not do it at that time because she was leaving for [place] 2 days later.  She had a nice trip to [place] and they were able to administer to a lot of the local people there.  03/03/2016 the patient is transitioning from Dr. Mare Ferrari, this is 6 months follow-up and she denies any chest pain or shortness of breath, she continues walking without difficulty, no syncope. She is tolerating Eliquis very well, no bleeding, she is compliant with. Her problems with arthritis in her foot. No lower extremity edema orthopnea or proximal nocturnal dyspnea.  Past Medical History  Diagnosis Date  . Atrial fibrillation ([place])   . SBE (subacute bacterial endocarditis) prophylaxis  candidate     for MR, AI    Past Surgical History  Procedure Laterality Date  . US echocardiography  10/2010    EF- 55-60%, mild  MR, mild AR  . Appendectomy    . Mastectomy  1987    With Reconstruction- Left     Current Outpatient Prescriptions  Medication Sig Dispense Refill  . Alendronate Sodium (FOSAMAX PO) Take 1 tablet by mouth once a week.     Marland Kitchen apixaban (ELIQUIS) 5 MG TABS tablet Take 1 tablet (5 mg total) by mouth 2 (two) times daily. 180 tablet 3  . Calcium Carbonate-Vitamin D (CALCIUM + D PO) Take 800 mg by mouth 2 (two) times daily.     . Cholecalciferol (VITAMIN D PO) Take 2,000 Units by mouth daily.      [name] Calcium (STOOL SOFTENER PO) Take 1 tablet by mouth as needed.     . fish oil-omega-3 fatty acids 1000 MG capsule Take 1 g by mouth daily.      . metoprolol succinate (TOPROL-XL) 25 MG 24 hr tablet Take 1 tablet (25 mg total) by mouth daily. 90 tablet 2  . Multiple Vitamin (MULTIVITAMIN PO) Take 1 tablet by mouth daily.      No current facility-administered medications for this visit.    Allergies:   Shellfish-derived products    Social History:  The patient  reports that she has never smoked. She does not have any smokeless tobacco history on file.   Family History:  The patient's family history includes Heart disease in her father and mother; Hypertension in  her father and mother.    ROS:  Please see the history of present illness.   Otherwise, review of systems are positive for none.   All other systems are reviewed and negative.    PHYSICAL EXAM: VS:  BP 122/82 mmHg  Pulse 89  Ht 5\' 3"  (1.6 m)  Wt 163 lb (73.936 kg)  BMI 28.88 kg/m2  SpO2 97% , BMI Body mass index is 28.88 kg/(m^2). GEN: Well nourished, well developed, in no acute distress HEENT: normal Neck: no JVD, carotid bruits, or masses Cardiac: Irregularly irregular.  Mld diastolic murmur of aortic insufficiency.  No gallop., rubs, or ,no edema  Respiratory:  clear to auscultation  bilaterally, normal work of breathing GI: soft, nontender, nondistended, + BS MS: no deformity or atrophy Skin: warm and dry, no rash Neuro:  Strength and sensation are intact Psych: euthymic mood, full affect  EKG:  EKG is not ordered today.  TTE: 12/2014  - Left ventricle: The cavity size was normal. Wall thickness was normal. Systolic function was normal. The estimated ejection fraction was in the range of 55% to 60%. Wall motion was normal; there were no regional wall motion abnormalities. - Aortic valve: There was moderate regurgitation. - Left atrium: The atrium was severely dilated. - Right atrium: The atrium was moderately dilated. - Pulmonary arteries: Systolic pressure was moderately increased. PA peak pressure: 42 mm Hg (S).  Recent Labs: 10/07/2015: BUN 26*; Creat 0.85; Hemoglobin 13.9; Platelets 276; Potassium 4.3; Sodium 139   Lipid Panel No results found for: CHOL, TRIG, HDL, CHOLHDL, VLDL, LDLCALC, LDLDIRECT   Wt Readings from Last 3 Encounters:  03/03/16 163 lb (73.936 kg)  01/02/16 160 lb (72.576 kg)  09/10/15 167 lb 12.8 oz (76.114 kg)       ASSESSMENT AND PLAN:  1. Chronic permanent atrial fibrillation with controlled ventricular response. Biatrial enlargement on echo. 2. Moderate aortic insufficiency by echocardiogram. 3. Trivial mitral regurgitation by echo 4. Weight gain  Follow-up in 6 months continue same management for anticoagulation and rate control. No symptoms changes in regards to aortic regurgitation, on the most recent echo in March 2016 moderate AI and mild pulmonary hypertension normal size of the left ventricle. We will repeat again in March 2018, unless her symptoms change.  Follow-up in 6 months with labs prior to her appointment.  Signed, Dorothy Spark, MD 03/03/2016 10:52 AM    [facility] [place], [place], [place]  [address] Phone: [phone]; Fax: [phone]

## 2016-03-03 NOTE — Patient Instructions (Signed)
Medication Instructions:   Your physician recommends that you continue on your current medications as directed. Please refer to the Current Medication list given to you today.   Labwork:  PRIOR TO YOUR 6 MONTH FOLLOW-UP APPOINTMENT WITH DR NELSON--WE WILL CHECK A CMET AND CBC W DIFF    Follow-Up:  Your physician wants you to follow-up in: [place] will receive a reminder letter in the mail two months in advance. If you don't receive a letter, please call our office to schedule the follow-up appointment.  PLEASE HAVE YOUR LABS DONE PRIOR TO THIS APPOINTMENT        If you need a refill on your cardiac medications before your next appointment, please call your pharmacy.

## 2016-03-04 ENCOUNTER — Emergency Department (HOSPITAL_COMMUNITY): Payer: Commercial Managed Care - HMO

## 2016-03-04 ENCOUNTER — Encounter (HOSPITAL_COMMUNITY): Payer: Self-pay | Admitting: Emergency Medicine

## 2016-03-04 ENCOUNTER — Emergency Department (HOSPITAL_COMMUNITY)
Admission: EM | Admit: 2016-03-04 | Discharge: 2016-03-04 | Disposition: A | Discharge status: Home / Self Care | Payer: Commercial Managed Care - HMO | Attending: Emergency Medicine | Admitting: Emergency Medicine

## 2016-03-04 DIAGNOSIS — Z79899 Other long term (current) drug therapy: Secondary | ICD-10-CM | POA: Diagnosis not present

## 2016-03-04 DIAGNOSIS — Z7901 Long term (current) use of anticoagulants: Secondary | ICD-10-CM | POA: Diagnosis not present

## 2016-03-04 DIAGNOSIS — R079 Chest pain, unspecified: Secondary | ICD-10-CM | POA: Diagnosis not present

## 2016-03-04 DIAGNOSIS — S6992XA Unspecified injury of left wrist, hand and finger(s), initial encounter: Secondary | ICD-10-CM | POA: Diagnosis not present

## 2016-03-04 DIAGNOSIS — R0789 Other chest pain: Secondary | ICD-10-CM | POA: Diagnosis not present

## 2016-03-04 DIAGNOSIS — M79642 Pain in left hand: Secondary | ICD-10-CM | POA: Diagnosis not present

## 2016-03-04 DIAGNOSIS — M79641 Pain in right hand: Secondary | ICD-10-CM | POA: Insufficient documentation

## 2016-03-04 DIAGNOSIS — S20219A Contusion of unspecified front wall of thorax, initial encounter: Secondary | ICD-10-CM | POA: Insufficient documentation

## 2016-03-04 DIAGNOSIS — Y939 Activity, unspecified: Secondary | ICD-10-CM | POA: Insufficient documentation

## 2016-03-04 DIAGNOSIS — I4891 Unspecified atrial fibrillation: Secondary | ICD-10-CM | POA: Diagnosis not present

## 2016-03-04 DIAGNOSIS — Y999 Unspecified external cause status: Secondary | ICD-10-CM | POA: Insufficient documentation

## 2016-03-04 DIAGNOSIS — S6991XA Unspecified injury of right wrist, hand and finger(s), initial encounter: Secondary | ICD-10-CM | POA: Diagnosis not present

## 2016-03-04 DIAGNOSIS — Y92410 Unspecified street and highway as the place of occurrence of the external cause: Secondary | ICD-10-CM | POA: Insufficient documentation

## 2016-03-04 LAB — I-STAT CHEM 8, ED
BUN: 39 mg/dL — AB (ref 6–20)
Calcium, Ion: 1.19 mmol/L (ref 1.13–1.30)
Chloride: 102 mmol/L (ref 101–111)
Creatinine, Ser: 1.2 mg/dL — ABNORMAL HIGH (ref 0.44–1.00)
Glucose, Bld: 104 mg/dL — ABNORMAL HIGH (ref 65–99)
HEMATOCRIT: 46 % (ref 36.0–46.0)
Hemoglobin: 15.6 g/dL — ABNORMAL HIGH (ref 12.0–15.0)
Potassium: 4.8 mmol/L (ref 3.5–5.1)
SODIUM: 139 mmol/L (ref 135–145)
TCO2: 28 mmol/L (ref 0–100)

## 2016-03-04 MED ORDER — METHOCARBAMOL 500 MG PO TABS: 500.0000 mg | ORAL_TABLET | Freq: Two times a day (BID) | ORAL | Status: DC

## 2016-03-04 MED ORDER — IOPAMIDOL (ISOVUE-300) INJECTION 61%
75.0000 mL | Freq: Once | INTRAVENOUS | Status: AC | PRN
  Administered 2016-03-04: 75 mL via INTRAVENOUS

## 2016-03-04 MED ORDER — METHOCARBAMOL 500 MG PO TABS
1000.0000 mg | ORAL_TABLET | Freq: Once | ORAL | Status: AC
  Administered 2016-03-04: 1000 mg via ORAL
  Filled 2016-03-04: qty 2
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
## 2016-03-04 NOTE — ED Notes (Signed)
Notified Samantha PA, patient requesting how much longer would be for her radiology results.  Aldona Bar stated she is waiting to see if Dr Zenia Resides has seen patient yet or not.
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
## 2016-03-11 DIAGNOSIS — J45909 Unspecified asthma, uncomplicated: Secondary | ICD-10-CM | POA: Diagnosis not present

## 2016-03-17 DIAGNOSIS — R05 Cough: Secondary | ICD-10-CM | POA: Diagnosis not present

## 2016-03-24 ENCOUNTER — Institutional Professional Consult (permissible substitution): Payer: Commercial Managed Care - HMO | Admitting: Internal Medicine

## 2016-04-07 DIAGNOSIS — S63652A Sprain of metacarpophalangeal joint of right middle finger, initial encounter: Secondary | ICD-10-CM | POA: Diagnosis not present

## 2016-04-07 DIAGNOSIS — M7989 Other specified soft tissue disorders: Secondary | ICD-10-CM | POA: Diagnosis not present

## 2016-04-07 DIAGNOSIS — M79641 Pain in right hand: Secondary | ICD-10-CM | POA: Diagnosis not present

## 2016-04-26 ENCOUNTER — Institutional Professional Consult (permissible substitution): Payer: Commercial Managed Care - HMO | Admitting: Internal Medicine

## 2016-04-28 DIAGNOSIS — M79641 Pain in right hand: Secondary | ICD-10-CM | POA: Diagnosis not present

## 2016-05-04 ENCOUNTER — Ambulatory Visit (INDEPENDENT_AMBULATORY_CARE_PROVIDER_SITE_OTHER): Payer: Commercial Managed Care - HMO | Admitting: Internal Medicine

## 2016-05-04 ENCOUNTER — Encounter: Payer: Self-pay | Admitting: Internal Medicine

## 2016-05-04 ENCOUNTER — Other Ambulatory Visit (INDEPENDENT_AMBULATORY_CARE_PROVIDER_SITE_OTHER): Payer: Commercial Managed Care - HMO

## 2016-05-04 ENCOUNTER — Ambulatory Visit (INDEPENDENT_AMBULATORY_CARE_PROVIDER_SITE_OTHER)
Admission: RE | Admit: 2016-05-04 | Discharge: 2016-05-04 | Disposition: A | Discharge status: Home / Self Care | Payer: Commercial Managed Care - HMO | Source: Ambulatory Visit | Attending: Internal Medicine | Admitting: Internal Medicine

## 2016-05-04 VITALS — BP 128/80 | HR 101 | Ht 63.0 in | Wt 154.2 lb

## 2016-05-04 DIAGNOSIS — R05 Cough: Secondary | ICD-10-CM

## 2016-05-04 DIAGNOSIS — R058 Other specified cough: Secondary | ICD-10-CM

## 2016-05-04 DIAGNOSIS — R938 Abnormal findings on diagnostic imaging of other specified body structures: Secondary | ICD-10-CM

## 2016-05-04 DIAGNOSIS — R9389 Abnormal findings on diagnostic imaging of other specified body structures: Secondary | ICD-10-CM

## 2016-05-04 LAB — CBC WITH DIFFERENTIAL/PLATELET
BASOS PCT: 0.6 % (ref 0.0–3.0)
Basophils Absolute: 0 10*3/uL (ref 0.0–0.1)
EOS PCT: 1.5 % (ref 0.0–5.0)
Eosinophils Absolute: 0.1 10*3/uL (ref 0.0–0.7)
HCT: 44.6 % (ref 36.0–46.0)
Hemoglobin: 15 g/dL (ref 12.0–15.0)
LYMPHS ABS: 2.6 10*3/uL (ref 0.7–4.0)
Lymphocytes Relative: 32.1 % (ref 12.0–46.0)
MCHC: 33.6 g/dL (ref 30.0–36.0)
MCV: 91.6 fl (ref 78.0–100.0)
MONOS PCT: 8 % (ref 3.0–12.0)
Monocytes Absolute: 0.6 10*3/uL (ref 0.1–1.0)
NEUTROS ABS: 4.6 10*3/uL (ref 1.4–7.7)
NEUTROS PCT: 57.8 % (ref 43.0–77.0)
PLATELETS: 257 10*3/uL (ref 150.0–400.0)
RBC: 4.87 Mil/uL (ref 3.87–5.11)
RDW: 14.1 % (ref 11.5–15.5)
WBC: 8 10*3/uL (ref 4.0–10.5)

## 2016-05-04 LAB — NITRIC OXIDE: Nitric Oxide: 25

## 2016-05-04 MED ORDER — PANTOPRAZOLE SODIUM 40 MG PO TBEC: 40.0000 mg | DELAYED_RELEASE_TABLET | Freq: Every day | ORAL | Status: DC

## 2016-05-04 MED ORDER — FAMOTIDINE 20 MG PO TABS: ORAL_TABLET | ORAL | Status: DC

## 2016-05-04 NOTE — Patient Instructions (Signed)
Try off fosfamax and fish oil until next visit  Pantoprazole (protonix) 40 mg   Take  30-60 min before first meal of the day and Pepcid (famotidine)  20 mg one @  bedtime until return to office - this is the best way to tell whether stomach acid is contributing to your problem.   GERD (REFLUX)  is an extremely common cause of respiratory symptoms just like yours , many times with no obvious heartburn at all.    It can be treated with medication, but also with lifestyle changes including elevation of the head of your bed (ideally with 6 inch  bed blocks),  Smoking cessation, avoidance of late meals, excessive alcohol, and avoid fatty foods, chocolate, peppermint, colas, red wine, and acidic juices such as orange juice.  NO MINT OR MENTHOL PRODUCTS SO NO COUGH DROPS  USE SUGARLESS CANDY INSTEAD (Jolley ranchers or Stover's or Life Savers) or even ice chips will also do - the key is to swallow to prevent all throat clearing. NO OIL BASED VITAMINS - use powdered substitutes.    Please remember to go to the lab and x-ray department downstairs for your tests - we will call you with the results when they are available.     Please schedule a follow up office visit in 6 weeks, call sooner if needed

## 2016-05-04 NOTE — Assessment & Plan Note (Addendum)
FENO 05/04/2016  =   25 so not likely related to any allergic or eosinophilic mediated cough or cough variant asthma - Allergy profile 05/04/2016 >  Eos 0.1 /  IgE  Pending  - trial off fosfamax and on gerd rx x 6 weeks   The most common causes of chronic cough in immunocompetent adults include the following: upper airway cough syndrome (UACS), previously referred to as postnasal drip syndrome (PNDS), which is caused by variety of rhinosinus conditions; (2) asthma; (3) GERD; (4) chronic bronchitis from cigarette smoking or other inhaled environmental irritants; (5) nonasthmatic eosinophilic bronchitis; and (6) bronchiectasis.   These conditions, singly or in combination, have accounted for up to 94% of the causes of chronic cough in prospective studies.   Other conditions have constituted no >6% of the causes in prospective studies These have included bronchogenic carcinoma, chronic interstitial pneumonia, sarcoidosis, left ventricular failure, ACEI-induced cough, and aspiration from a condition associated with pharyngeal dysfunction.    Chronic cough is often simultaneously caused by more than one condition. A single cause has been found from 38 to 82% of the time, multiple causes from 18 to 62%. Multiply caused cough has been the result of three diseases up to 42% of the time.       Most likely this is  Classic Upper airway cough syndrome, so named because it's frequently impossible to sort out how much is  CR/sinusitis with freq throat clearing (which can be related to primary GERD)   vs  causing  secondary (" extra esophageal")  GERD from wide swings in gastric pressure that occur with throat clearing, often  promoting self use of mint and menthol lozenges that reduce the lower esophageal sphincter tone and exacerbate the problem further in a cyclical fashion.   These are the same pts (now being labeled as having "irritable larynx syndrome" by some cough centers) who not infrequently have a history  of having failed to tolerate ace inhibitors,  dry powder inhalers or biphosphonates or report having atypical reflux symptoms that don't respond to standard doses of PPI , and are easily confused as having aecopd or asthma flares by even experienced allergists/ pulmonologists.   Of the three most common causes of chronic cough, only one (GERD)  can actually cause the other two (asthma and post nasal drip syndrome)  and perpetuate the cylce of cough inducing airway trauma, inflammation, heightened sensitivity to reflux which is prompted by the cough itself via a cyclical mechanism.    This may partially respond to steroids and look like asthma and post nasal drainage but never erradicated completely unless the cough and the secondary reflux are eliminated, preferably both at the same time.  While not intuitively obvious, many patients with chronic low grade reflux do not cough until there is a secondary insult that disturbs the protective epithelial barrier and exposes sensitive nerve endings.  This can be viral or direct physical injury such as with an endotracheal tube.   The point is that once this occurs, it is difficult to eliminate using anything but a maximally effective acid suppression regimen at least in the short run, accompanied by an appropriate diet to address non acid GERD.   rec off fosfamax/ on acid suppression and diet x 6 weeks then regroup and go ahead and check for allergy profile today with cough variant asthnma in ddx but  less likely with FENO 25   Total time devoted to counseling  = 35/54m review case with pt/ discussion  of options/alternatives/ personally creating written instructions  in presence of pt  then going over those specific  Instructions directly with the pt including how to use all of the meds but in particular covering each new medication in detail and the difference between the maintenance/automatic meds and the prns using an action plan format for the latter.

## 2016-05-04 NOTE — Progress Notes (Signed)
Subjective:    Patient ID: Kathryn Chang, female    DOB: Aug 12, 1941,   MRN: [id number]  HPI  37 yowf never smoker never previous resp symptoms new onset cough /wheeze with URI Aug 2016 req pred/abx recurred in May 2017 and that 2 courses of prednisone and raspy throat sensation / clearing throat did not improve and can't sing in choir with abn cxr 03/17/16 so referred to pulmonary clinic 05/04/2016 by Dr [name]  05/04/2016 1st [facility] office visit/ Wert   Chief Complaint  Patient presents with  . Pulmonary Consult    Referred by Dr. [name] for cough. Pt denies a cough, but states she is here for eval of abn cxr done May 2017. She denies any respiratory co's today.   other than ongoing issues related to raspy voice / throat clearing doing fine/ does fine sleeping and no activity limitation/ does have pnds with some watery nasal discharge x one year. No resp to otcs/ maint rx with fosfamax and fish oil   No obvious other patterns in day to day or daytime variabilty or assoc doe  or cp or chest tightness, subjective wheeze overt sinus or hb symptoms. No unusual exp hx or h/o childhood pna/ asthma or knowledge of premature birth.  Sleeping ok without nocturnal  or early am exacerbation  of respiratory  c/o's or need for noct saba. Also denies any obvious fluctuation of symptoms with weather or environmental changes or other aggravating or alleviating factors except as outlined above   Current Medications, Allergies, Complete Past Medical History, Past Surgical History, Family History, and Social History were reviewed in [employer] record.     Review of Systems  Constitutional: Negative for fever, chills and unexpected weight change.  HENT: Negative for congestion, dental problem, ear pain, nosebleeds, postnasal drip, rhinorrhea, sinus pressure, sneezing, sore throat, trouble swallowing and voice change.   Eyes: Negative for visual  disturbance.  Respiratory: Negative for cough, choking and shortness of breath.   Cardiovascular: Negative for chest pain and leg swelling.  Gastrointestinal: Negative for vomiting, abdominal pain and diarrhea.  Genitourinary: Negative for difficulty urinating.  Musculoskeletal: Positive for arthralgias.  Skin: Negative for rash.  Neurological: Negative for tremors, syncope and headaches.  Hematological: Does not bruise/bleed easily.       Objective:   Physical Exam   Very pleasant amb wf nad freq throat clearing   Wt Readings from Last 3 Encounters:  05/04/16 154 lb 3.2 oz (69.945 kg)  03/04/16 163 lb (73.936 kg)  03/03/16 163 lb (73.936 kg)    Vital signs reviewed  HEENT: nl dentition, turbinates, and oropharynx. Nl external ear canals without cough reflex   NECK :  without JVD/Nodes/TM/ nl carotid upstrokes bilaterally   LUNGS: no acc muscle use,  Nl contour chest which is clear to A and P bilaterally without cough on insp or exp maneuvers   CV:  RRR  no s3 or murmur or increase in P2, no edema   ABD:  soft and nontender with nl inspiratory excursion in the supine position. No bruits or organomegaly, bowel sounds nl  MS:  Nl gait/ ext warm without deformities, calf tenderness, cyanosis or clubbing No obvious joint restrictions   SKIN: warm and dry without lesions    NEURO:  alert, approp, nl sensorium with  no motor deficits    I personally reviewed images and agree with radiology impression as follows:  CT Chest     03/04/16  No acute or traumatic finding.  CXR 03/17/16  ? Copd ? LLL atx     CXR PA and Lateral:   05/04/2016 :    I personally reviewed images and agree with radiology impression as follows:    Stable. Hyperexpansion suggests emphysema. No acute cardiopulmonary findings   Labs: cbc with diff/ eos/ allergy profile         Assessment & Plan:

## 2016-05-05 LAB — RESPIRATORY ALLERGY PROFILE REGION II ~~LOC~~
Allergen, Cedar tree, t12: <0.1 kU/L
Allergen, Cottonwood, t14: <0.1 kU/L
Allergen, Mouse Urine Protein, e78: <0.1 kU/L
Allergen, Oak,t7: <0.1 kU/L
Alternaria Alternata: <0.1 kU/L
Cat Dander: 2.73 kU/L — ABNORMAL HIGH
DOG DANDER: 0.23 kU/L — AB
Elm IgE: <0.1 kU/L
IGE (IMMUNOGLOBULIN E), SERUM: 146 kU/L — AB (ref <115)
Pecan/Hickory Tree IgE: <0.1 kU/L
Rough Pigweed  IgE: <0.1 kU/L
Timothy Grass: <0.1 kU/L

## 2016-05-05 NOTE — Progress Notes (Signed)
Quick Note:  lmtcb ______ 

## 2016-05-05 NOTE — Progress Notes (Signed)
Quick Note:  msg already left for pt tcb ______

## 2016-05-06 ENCOUNTER — Telehealth: Payer: Self-pay | Admitting: Internal Medicine

## 2016-05-06 DIAGNOSIS — S63652D Sprain of metacarpophalangeal joint of right middle finger, subsequent encounter: Secondary | ICD-10-CM | POA: Diagnosis not present

## 2016-05-06 DIAGNOSIS — M79641 Pain in right hand: Secondary | ICD-10-CM | POA: Diagnosis not present

## 2016-05-06 NOTE — Telephone Encounter (Signed)
Notes Recorded by Rosana Berger, CMA on 05/06/2016 at 2:30 PM Spoke with pt and notified of results per Dr. Melvyn Novas. Pt verbalized understanding and denied any questions.

## 2016-05-06 NOTE — Progress Notes (Signed)
Quick Note:  Spoke with pt and notified of results per Dr. Wert. Pt verbalized understanding and denied any questions.  ______ 

## 2016-05-09 DIAGNOSIS — R9389 Abnormal findings on diagnostic imaging of other specified body structures: Secondary | ICD-10-CM | POA: Insufficient documentation

## 2016-05-09 NOTE — Assessment & Plan Note (Signed)
Ct chest 03/04/16 nl s emphysema CXR  05/04/2016 nl x mild hyperinflation   Since she does not have emphysematous changes on CT the most likely explanation for her hyperinflation is loss of elastic recoil with aging, not copd or asthma, and no further f/u is needed for this issue/ reviewed with pt

## 2016-05-17 ENCOUNTER — Encounter: Payer: Self-pay | Admitting: Internal Medicine

## 2016-06-03 DIAGNOSIS — M79641 Pain in right hand: Secondary | ICD-10-CM | POA: Diagnosis not present

## 2016-06-03 DIAGNOSIS — S63652D Sprain of metacarpophalangeal joint of right middle finger, subsequent encounter: Secondary | ICD-10-CM | POA: Diagnosis not present

## 2016-06-15 ENCOUNTER — Encounter: Payer: Self-pay | Admitting: Internal Medicine

## 2016-06-15 ENCOUNTER — Ambulatory Visit (INDEPENDENT_AMBULATORY_CARE_PROVIDER_SITE_OTHER): Payer: Commercial Managed Care - HMO | Admitting: Internal Medicine

## 2016-06-15 VITALS — BP 124/84 | HR 96 | Ht 63.0 in | Wt 167.0 lb

## 2016-06-15 DIAGNOSIS — R05 Cough: Secondary | ICD-10-CM

## 2016-06-15 DIAGNOSIS — R058 Other specified cough: Secondary | ICD-10-CM

## 2016-06-15 NOTE — Patient Instructions (Signed)
Stop protonix and just use pepcid after bfast daily x month and stop  If drainage gets worse try zyrtec 10 mg - take at bedtime if makes you sleepy   Discuss the fosfamax with your doctor who may want you back on it or give you the alternative Reclast yearly   Continue diet: GERD (REFLUX)  is an extremely common cause of respiratory symptoms just like yours , many times with no obvious heartburn at all.    It can be treated with medication, but also with lifestyle changes including elevation of the head of your bed (ideally with 6 inch  bed blocks),  Smoking cessation, avoidance of late meals, excessive alcohol, and avoid fatty foods, chocolate, peppermint, colas, red wine, and acidic juices such as orange juice.  NO MINT OR MENTHOL PRODUCTS SO NO COUGH DROPS   USE SUGARLESS CANDY INSTEAD (Jolley ranchers or Stover's or Life Savers) or even ice chips will also do - the key is to swallow to prevent all throat clearing. NO OIL BASED VITAMINS - use powdered substitutes.   If you are satisfied with your treatment plan,  let your doctor know and he/she can either refill your medications or you can return here when your prescription runs out.     If in any way you are not 100% satisfied,  please tell us.  If 100% better, tell your friends!  Pulmonary follow up is as needed

## 2016-06-15 NOTE — Progress Notes (Signed)
Subjective:    Patient ID: Kathryn Chang, female    DOB: [birth year]    MRN: [id number]  Brief patient profile:  61 yowf never smoker never previous resp symptoms new onset cough /wheeze with URI Aug 2016 req pred/abx recurred in May 2017 and that 2 courses of prednisone and raspy throat sensation / clearing throat did not improve and can't sing in choir with abn cxr 03/17/16 so referred to pulmonary clinic 05/04/2016 by Dr [name]   History of Present Illness  05/04/2016 1st [facility] office visit/ Kathryn Chang   Chief Complaint  Patient presents with  . Pulmonary Consult    Referred by Dr. [name] for cough. Pt denies a cough, but states she is here for eval of abn cxr done May 2017. She denies any respiratory co's today.   other than ongoing issues related to raspy voice / throat clearing doing fine/ does fine sleeping and no activity limitation/ does have pnds with some watery nasal discharge x one year. No resp to otcs/ maint rx with fosfamax and fish oil  rec Try off fosfamax and fish oil until next visit Pantoprazole (protonix) 40 mg   Take  30-60 min before first meal of the day and Pepcid (famotidine)  20 mg one @  bedtime until return to office - this is the best way to tell whether stomach acid is contributing to your problem.  GERD  Diet    06/15/2016  f/u ov/Mccauley Diehl re: uacs assoc with rhinitis with Pos RAST to cat> dog  Chief Complaint  Patient presents with  . Follow-up    Pt states doing well and denies any cough. No new co's today.   main issue nose runs intermittent/watery / ? Better on cruise not even aware of when x never bothers sleep  Able to sing now, Not limited by breathing from desired activities    No obvious day to day or daytime variability or assoc chronic cough or cp or chest tightness, subjective wheeze or overt sinus or hb symptoms. No unusual exp hx or h/o childhood pna/ asthma or knowledge of premature birth.  Sleeping ok without  nocturnal  or early am exacerbation  of respiratory  c/o's or need for noct saba. Also denies any obvious fluctuation of symptoms with weather or environmental changes or other aggravating or alleviating factors except as outlined above   Current Medications, Allergies, Complete Past Medical History, Past Surgical History, Family History, and Social History were reviewed in [employer] record.  ROS  The following are not active complaints unless bolded sore throat, dysphagia, dental problems, itching, sneezing,  nasal congestion or excess/ purulent secretions, ear ache,   fever, chills, sweats, unintended wt loss, classically pleuritic or exertional cp, hemoptysis,  orthopnea pnd or leg swelling, presyncope, palpitations, abdominal pain, anorexia, nausea, vomiting, diarrhea  or change in bowel or bladder habits, change in stools or urine, dysuria,hematuria,  rash, arthralgias, visual complaints, headache, numbness, weakness or ataxia or problems with walking or coordination,  change in mood/affect or memory.            Objective:   Physical Exam   Very pleasant amb wf nad    06/15/2016        167       03/04/16 163 lb (73.936 kg)  03/03/16 163 lb (73.936 kg)    Vital signs reviewed  HEENT: nl dentition, turbinates, and oropharynx. Nl external ear canals without cough reflex   NECK :  without JVD/Nodes/TM/ nl carotid upstrokes bilaterally   LUNGS: no acc muscle use,  Nl contour chest which is clear to A and P bilaterally without cough on insp or exp maneuvers   CV:  RRR  no s3 or murmur or increase in P2, no edema   ABD:  soft and nontender with nl inspiratory excursion in the supine position. No bruits or organomegaly, bowel sounds nl  MS:  Nl gait/ ext warm without deformities, calf tenderness, cyanosis or clubbing No obvious joint restrictions   SKIN: warm and dry without lesions    NEURO:  alert, approp, nl sensorium with  no motor  deficits     CXR PA and Lateral:   05/04/2016 :    I personally reviewed images and agree with radiology impression as follows:    Stable. Hyperexpansion suggests emphysema. No acute cardiopulmonary findings          Assessment & Plan:

## 2016-06-15 NOTE — Assessment & Plan Note (Signed)
FENO 05/04/2016  =   25 - Allergy profile 05/04/2016 >  Eos 0.1 /  IgE  146 Pos RAST Cat >> dog  - trial off fosfamax and on gerd rx x 6 weeks > 06/15/2016  Resolved    I had an extended final summary discussion with the patient reviewing all relevant studies completed to date and  lasting 15 to 20 minutes of a 25 minute visit on the following issues:    1) clearly not asthma as much better with rx for uacs  2) allergy tests reviewed, not sure there is correlation as no longer has cat and no change on cruise but if watery rhinitis still an issue rec prn zyrtec and if not happy > formal allergy eval Dr Bruna Potter group  3) gerd mech may have been exac by fosfamax so try wean off ppi first, then pepcid, then consider adding back either fosfamax or reclast  > Follow up per Primary Care planned     Each maintenance medication was reviewed in detail including most importantly the difference between maintenance and as needed and under what circumstances the prns are to be used.  Please see instructions for details which were reviewed in writing and the patient given a copy.

## 2016-08-16 DIAGNOSIS — Z23 Encounter for immunization: Secondary | ICD-10-CM | POA: Diagnosis not present

## 2016-08-16 DIAGNOSIS — Z79899 Other long term (current) drug therapy: Secondary | ICD-10-CM | POA: Diagnosis not present

## 2016-08-25 ENCOUNTER — Other Ambulatory Visit: Payer: Commercial Managed Care - HMO | Admitting: *Deleted

## 2016-08-25 DIAGNOSIS — I119 Hypertensive heart disease without heart failure: Secondary | ICD-10-CM

## 2016-08-25 DIAGNOSIS — I482 Chronic atrial fibrillation, unspecified: Secondary | ICD-10-CM

## 2016-08-25 DIAGNOSIS — I351 Nonrheumatic aortic (valve) insufficiency: Secondary | ICD-10-CM

## 2016-08-25 DIAGNOSIS — I481 Persistent atrial fibrillation: Secondary | ICD-10-CM | POA: Diagnosis not present

## 2016-08-25 DIAGNOSIS — I4819 Other persistent atrial fibrillation: Secondary | ICD-10-CM

## 2016-08-25 LAB — CBC WITH DIFFERENTIAL/PLATELET
Basophils Absolute: 0 cells/uL (ref 0–200)
Basophils Relative: 0 %
Eosinophils Absolute: 65 cells/uL (ref 15–500)
Eosinophils Relative: 1 %
HCT: 41.6 % (ref 35.0–45.0)
Hemoglobin: 14 g/dL (ref 11.7–15.5)
Lymphocytes Relative: 39 %
Lymphs Abs: 2535 cells/uL (ref 850–3900)
MCH: 30.6 pg (ref 27.0–33.0)
MCHC: 33.7 g/dL (ref 32.0–36.0)
MCV: 91 fL (ref 80.0–100.0)
MPV: 11.4 fL (ref 7.5–12.5)
Monocytes Absolute: 650 cells/uL (ref 200–950)
Monocytes Relative: 10 %
Neutro Abs: 3250 cells/uL (ref 1500–7800)
Neutrophils Relative %: 50 %
Platelets: 273 10*3/uL (ref 140–400)
RBC: 4.57 MIL/uL (ref 3.80–5.10)
RDW: 14.2 % (ref 11.0–15.0)
WBC: 6.5 10*3/uL (ref 3.8–10.8)

## 2016-08-25 LAB — COMPREHENSIVE METABOLIC PANEL
ALT: 17 U/L (ref 6–29)
AST: 22 U/L (ref 10–35)
Albumin: 4.1 g/dL (ref 3.6–5.1)
Alkaline Phosphatase: 56 U/L (ref 33–130)
BUN: 25 mg/dL (ref 7–25)
CO2: 26 mmol/L (ref 20–31)
Calcium: 9.6 mg/dL (ref 8.6–10.4)
Chloride: 103 mmol/L (ref 98–110)
Creat: 0.99 mg/dL — ABNORMAL HIGH (ref 0.60–0.93)
Glucose, Bld: 107 mg/dL — ABNORMAL HIGH (ref 65–99)
Potassium: 4.3 mmol/L (ref 3.5–5.3)
Sodium: 139 mmol/L (ref 135–146)
Total Bilirubin: 0.5 mg/dL (ref 0.2–1.2)
Total Protein: 6.8 g/dL (ref 6.1–8.1)

## 2016-08-30 ENCOUNTER — Ambulatory Visit (HOSPITAL_COMMUNITY)
Admission: RE | Admit: 2016-08-30 | Discharge: 2016-08-30 | Disposition: A | Discharge status: Home / Self Care | Payer: Commercial Managed Care - HMO | Source: Ambulatory Visit | Attending: Family Medicine | Admitting: Family Medicine

## 2016-08-30 ENCOUNTER — Encounter (HOSPITAL_COMMUNITY): Payer: Self-pay

## 2016-08-30 DIAGNOSIS — M810 Age-related osteoporosis without current pathological fracture: Secondary | ICD-10-CM | POA: Diagnosis not present

## 2016-08-30 MED ORDER — ZOLEDRONIC ACID 5 MG/100ML IV SOLN
5.0000 mg | Freq: Once | INTRAVENOUS | Status: AC
  Administered 2016-08-30: 5 mg via INTRAVENOUS
  Filled 2016-08-30: qty 100

## 2016-08-30 MED ORDER — SODIUM CHLORIDE 0.9 % IV SOLN
Freq: Once | INTRAVENOUS | Status: AC
  Administered 2016-08-30: 10:00:00 via INTRAVENOUS

## 2016-08-30 NOTE — Progress Notes (Addendum)
[name] tolerated Reclast infusion well. Stayed for 15 minute post infusion observation. No signs of adverse reaction noted. Instructed to follow up with Dr. For problems and concerns once discharged from Short Stay. Patient verbalized understanding.

## 2016-08-30 NOTE — Discharge Instructions (Signed)

## 2016-09-01 ENCOUNTER — Ambulatory Visit (INDEPENDENT_AMBULATORY_CARE_PROVIDER_SITE_OTHER): Payer: Commercial Managed Care - HMO | Admitting: Cardiology

## 2016-09-01 VITALS — BP 124/66 | HR 64 | Ht 63.0 in | Wt 172.0 lb

## 2016-09-01 DIAGNOSIS — Z7901 Long term (current) use of anticoagulants: Secondary | ICD-10-CM

## 2016-09-01 DIAGNOSIS — I481 Persistent atrial fibrillation: Secondary | ICD-10-CM

## 2016-09-01 DIAGNOSIS — I351 Nonrheumatic aortic (valve) insufficiency: Secondary | ICD-10-CM | POA: Diagnosis not present

## 2016-09-01 DIAGNOSIS — I4819 Other persistent atrial fibrillation: Secondary | ICD-10-CM

## 2016-09-01 NOTE — Progress Notes (Signed)
Cardiology Office Note   Date:  09/01/2016   ID:  [name], DOB Apr 15, 1941, MRN [id number]  PCP:  Gennette Pac, MD  Cardiologist: Darlin Coco MD --[place]  Chief complain: 6 months follow-up   History of Present Illness: Kathryn Chang is a 75 y.o. female who presents for scheduled follow-up visit. She has a history of established atrial fibrillation and is on long-term Eliquis. The patient had an echocardiogram on 01/01/15 which showed normal left ventricular systolic function with an ejection fraction of 55-60%. There was moderate aortic insufficiency and there was biatrial enlargement. She was switched from digoxin to metoprolol for rate control. Since last visit she's had no new cardiac symptoms. She recently traveled to [place] and they were able to administer to a lot of the local people there.  09/01/2016 this is a 6 months follow-up, the patient states that she feels exactly the same as before she is able to do all activities of daily living and even travel without any symptoms of shortness of breath or chest pain. She also denies palpitations or dizziness or syncope. She has been compliant with her medications without any side effects, she has no bleeding on Eliquis.  Past Medical History:  Diagnosis Date  . Atrial fibrillation ([place])   . SBE (subacute bacterial endocarditis) prophylaxis candidate    for MR, AI   Past Surgical History:  Procedure Laterality Date  . APPENDECTOMY    . MASTECTOMY  1987   With Reconstruction- Left  . US ECHOCARDIOGRAPHY  10/2010   EF- 55-60%, mild  MR, mild AR   Current Outpatient Prescriptions  Medication Sig Dispense Refill  . acetaminophen (TYLENOL) 500 MG tablet Take 2,000 mg by mouth once as needed (pain.).    Marland Kitchen apixaban (ELIQUIS) 5 MG TABS tablet Take 1 tablet (5 mg total) by mouth 2 (two) times daily. 180 tablet 3  . Calcium Carbonate-Vitamin D (CALCIUM + D PO) Take 800 mg by mouth 2  (two) times daily.     . Cholecalciferol (VITAMIN D PO) Take 2,000 Units by mouth daily.      [name] Calcium (STOOL SOFTENER PO) Take 1 tablet by mouth as needed (constipation.).     Marland Kitchen famotidine (PEPCID) 20 MG tablet One at bedtime    . metoprolol succinate (TOPROL-XL) 25 MG 24 hr tablet Take 1 tablet (25 mg total) by mouth daily. 90 tablet 2  . Multiple Vitamin (MULTIVITAMIN PO) Take 1 tablet by mouth daily.     . zoledronic acid (RECLAST) 5 MG/100ML SOLN injection Inject 5 mg into the vein. Yearly     No current facility-administered medications for this visit.     Allergies:   Shellfish-derived products    Social History:  The patient  reports that she has never smoked. She has never used smokeless tobacco.   Family History:  The patient's family history includes Heart disease in her father and mother; Hypertension in her father and mother.    ROS:  Please see the history of present illness.   Otherwise, review of systems are positive for none.   All other systems are reviewed and negative.    PHYSICAL EXAM: VS:  BP 124/66   Pulse 64   Ht 5\' 3"  (1.6 m)   Wt 172 lb (78 kg)   BMI 30.47 kg/m  , BMI Body mass index is 30.47 kg/m. GEN: Well nourished, well developed, in no acute distress  HEENT: normal  Neck: no JVD, carotid  bruits, or masses Cardiac: Irregularly irregular.  Mild diastolic murmur of aortic insufficiency.  No gallop., rubs, or ,no edema  Respiratory:  clear to auscultation bilaterally, normal work of breathing GI: soft, nontender, nondistended, + BS MS: no deformity or atrophy  Skin: warm and dry, no rash Neuro:  Strength and sensation are intact Psych: euthymic mood, full affect  EKG:  EKG is not ordered today.  TTE: 12/2014  - Left ventricle: The cavity size was normal. Wall thickness was normal. Systolic function was normal. The estimated ejection fraction was in the range of 55% to 60%. Wall motion was normal; there were no regional wall  motion abnormalities. - Aortic valve: There was moderate regurgitation. - Left atrium: The atrium was severely dilated. - Right atrium: The atrium was moderately dilated. - Pulmonary arteries: Systolic pressure was moderately increased. PA peak pressure: 42 mm Hg (S).  Recent Labs: 08/25/2016: ALT 17; BUN 25; Creat 0.99; Hemoglobin 14.0; Platelets 273; Potassium 4.3; Sodium 139   Lipid Panel No results found for: CHOL, TRIG, HDL, CHOLHDL, VLDL, LDLCALC, LDLDIRECT   Wt Readings from Last 3 Encounters:  09/01/16 172 lb (78 kg)  08/30/16 172 lb 12.8 oz (78.4 kg)  06/15/16 167 lb (75.8 kg)    TTE: 12/2014 - Left ventricle: The cavity size was normal. Wall thickness was normal. Systolic function was normal. The estimated ejection fraction was in the range of 55% to 60%. Wall motion was normal; there were no regional wall motion abnormalities. - Aortic valve: There was moderate regurgitation. - Left atrium: The atrium was severely dilated. - Right atrium: The atrium was moderately dilated. - Pulmonary arteries: Systolic pressure was moderately increased. PA peak pressure: 42 mm Hg (S).    ASSESSMENT AND PLAN:  1. Chronic permanent atrial fibrillation with controlled ventricular response. Biatrial enlargement on echo. 2. Moderate aortic insufficiency by echocardiogram. 3. Trivial mitral regurgitation by echo  The patient continues to be completely asymptomatic with good level of exercise. We will schedule a follow-up visit in 6 months with an echocardiogram prior to that appointment as it will be 2 years since the last one. She has had blood work a week ago with normal electrolytes, normal creatinine and normal LFTs normal CBC.  Signed, Ena Dawley, MD 09/01/2016 12:07 PM    [address], [place], [place]  [id number] Phone: [phone]; Fax: [phone]

## 2016-09-01 NOTE — Patient Instructions (Signed)
Medication Instructions:   Your physician recommends that you continue on your current medications as directed. Please refer to the Current Medication list given to you today.    Testing/Procedures:  Your physician has requested that you have an echocardiogram. Echocardiography is a painless test that uses sound waves to create images of your heart. It provides your doctor with information about the size and shape of your heart and how well your heart's chambers and valves are working. This procedure takes approximately one hour. There are no restrictions for this procedure.  PER DR NELSON THIS SHOULD BE SCHEDULED A WEEK OR 2 PRIOR TO HER 6 MONTH FOLLOW-UP APPOINTMENT WITH HER IN THE CLINIC    Follow-Up:  Your physician wants you to follow-up in: [place] will receive a reminder letter in the mail two months in advance. If you don't receive a letter, please call our office to schedule the follow-up appointment.  PLEASE HAVE YOUR ECHO SCHEDULED PRIOR TO YOUR 6 MONTH FOLLOW-UP APPOINTMENT WITH DR [name]     If you need a refill on your cardiac medications before your next appointment, please call your pharmacy.

## 2016-10-04 DIAGNOSIS — Z853 Personal history of malignant neoplasm of breast: Secondary | ICD-10-CM | POA: Diagnosis not present

## 2016-10-04 DIAGNOSIS — Z1231 Encounter for screening mammogram for malignant neoplasm of breast: Secondary | ICD-10-CM | POA: Diagnosis not present

## 2016-10-26 ENCOUNTER — Other Ambulatory Visit: Payer: Self-pay

## 2016-10-26 MED ORDER — APIXABAN 5 MG PO TABS: 5.0000 mg | ORAL_TABLET | Freq: Two times a day (BID) | ORAL | 3 refills | Status: DC

## 2016-11-03 ENCOUNTER — Other Ambulatory Visit: Payer: Self-pay | Admitting: *Deleted

## 2016-11-03 MED ORDER — APIXABAN 5 MG PO TABS: 5.0000 mg | ORAL_TABLET | Freq: Two times a day (BID) | ORAL | 1 refills | Status: DC

## 2016-11-03 NOTE — Telephone Encounter (Signed)
Patient called and requested that the eliquis rx be sent to [facility].

## 2016-11-26 DIAGNOSIS — R8290 Unspecified abnormal findings in urine: Secondary | ICD-10-CM | POA: Diagnosis not present

## 2016-11-26 DIAGNOSIS — I4891 Unspecified atrial fibrillation: Secondary | ICD-10-CM | POA: Diagnosis not present

## 2016-11-26 DIAGNOSIS — Z86010 Personal history of colonic polyps: Secondary | ICD-10-CM | POA: Diagnosis not present

## 2016-11-26 DIAGNOSIS — Z79899 Other long term (current) drug therapy: Secondary | ICD-10-CM | POA: Diagnosis not present

## 2016-11-26 DIAGNOSIS — M810 Age-related osteoporosis without current pathological fracture: Secondary | ICD-10-CM | POA: Diagnosis not present

## 2016-11-26 DIAGNOSIS — Z0000 Encounter for general adult medical examination without abnormal findings: Secondary | ICD-10-CM | POA: Diagnosis not present

## 2016-11-26 DIAGNOSIS — E782 Mixed hyperlipidemia: Secondary | ICD-10-CM | POA: Diagnosis not present

## 2016-11-26 DIAGNOSIS — Z853 Personal history of malignant neoplasm of breast: Secondary | ICD-10-CM | POA: Diagnosis not present

## 2016-11-26 DIAGNOSIS — R7301 Impaired fasting glucose: Secondary | ICD-10-CM | POA: Diagnosis not present

## 2016-11-26 DIAGNOSIS — M899 Disorder of bone, unspecified: Secondary | ICD-10-CM | POA: Diagnosis not present

## 2016-12-06 ENCOUNTER — Ambulatory Visit: Payer: Commercial Managed Care - HMO | Admitting: Podiatry

## 2016-12-07 DIAGNOSIS — L270 Generalized skin eruption due to drugs and medicaments taken internally: Secondary | ICD-10-CM | POA: Diagnosis not present

## 2016-12-10 ENCOUNTER — Encounter: Payer: Self-pay | Admitting: Podiatry

## 2016-12-10 ENCOUNTER — Ambulatory Visit (INDEPENDENT_AMBULATORY_CARE_PROVIDER_SITE_OTHER): Payer: Medicare HMO | Admitting: Podiatry

## 2016-12-10 ENCOUNTER — Ambulatory Visit (INDEPENDENT_AMBULATORY_CARE_PROVIDER_SITE_OTHER): Payer: Medicare HMO

## 2016-12-10 DIAGNOSIS — M779 Enthesopathy, unspecified: Secondary | ICD-10-CM | POA: Diagnosis not present

## 2016-12-10 DIAGNOSIS — M79672 Pain in left foot: Secondary | ICD-10-CM | POA: Diagnosis not present

## 2016-12-10 DIAGNOSIS — M79671 Pain in right foot: Secondary | ICD-10-CM | POA: Diagnosis not present

## 2016-12-10 DIAGNOSIS — M7750 Other enthesopathy of unspecified foot: Secondary | ICD-10-CM

## 2016-12-10 MED ORDER — TRIAMCINOLONE ACETONIDE 10 MG/ML IJ SUSP
10.0000 mg | Freq: Once | INTRAMUSCULAR | Status: AC
  Administered 2016-12-10: 10 mg

## 2016-12-11 NOTE — Progress Notes (Signed)
Subjective:     Patient ID: Kathryn Chang, female   DOB: November 26, 1940, 76 y.o.   MRN: [id number]  HPI patient states I have a lot of pain on top of both feet that's been chronic in nature and makes it hard to wear shoe gear comfortably   Review of Systems     Objective:   Physical Exam Neurovascular status intact with patient found to have dorsal pain around the midtarsal joint bilateral with inflammation and fluid when palpated    Assessment:     Tendinitis dorsal foot bilateral with probable arthritis    Plan:     H&P condition reviewed and injections administered bilateral 3 mg Kenalog 5 mg Xylocaine which was tolerated well. 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

## 2016-12-27 ENCOUNTER — Other Ambulatory Visit: Payer: Self-pay | Admitting: *Deleted

## 2016-12-27 NOTE — Telephone Encounter (Signed)
Refill requested denied because request was filled on 11/03/16 & they stated it was sent out to pt on 11/09/16.

## 2016-12-28 ENCOUNTER — Other Ambulatory Visit: Payer: Self-pay | Admitting: Cardiology

## 2016-12-28 MED ORDER — METOPROLOL SUCCINATE ER 25 MG PO TB24: 25.0000 mg | ORAL_TABLET | Freq: Every day | ORAL | 2 refills | Status: DC
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
## 2017-01-18 DIAGNOSIS — L814 Other melanin hyperpigmentation: Secondary | ICD-10-CM | POA: Diagnosis not present

## 2017-01-18 DIAGNOSIS — D485 Neoplasm of uncertain behavior of skin: Secondary | ICD-10-CM | POA: Diagnosis not present

## 2017-01-18 DIAGNOSIS — D1801 Hemangioma of skin and subcutaneous tissue: Secondary | ICD-10-CM | POA: Diagnosis not present

## 2017-01-18 DIAGNOSIS — L821 Other seborrheic keratosis: Secondary | ICD-10-CM | POA: Diagnosis not present

## 2017-02-28 ENCOUNTER — Telehealth: Payer: Self-pay | Admitting: Cardiology

## 2017-02-28 NOTE — Telephone Encounter (Signed)
Called patient back no answer. Left message informing patient that her precert has been done on her echo The authorization number is [id number] and it exoires 03/30/2017 is she has any futher questions she can  Call me back I left my number.

## 2017-02-28 NOTE — Telephone Encounter (Signed)
New Message  Pt voiced for someone to call the insurance company//[employer] to received authorization or verification for Echo.  Please f/u with pt/[employer]

## 2017-02-28 NOTE — Telephone Encounter (Signed)
Pt is calling because she is to get an echo done as scheduled by Dr [name] for this Wednesday 5/9 .  [place] advised her to follow-up with our precert dept to have this authorized and precerted before her scheduled appt.  Pt states [employer] needs to authorization number and they need to know that she is having a complete echo done, as order in the system indicates.  Informed the pt that I will call and forward this to our pre-cert Rep who handles echos to follow-up with the pt and [employer] accordingly.  Pt verbalized understanding and agrees with this plan.

## 2017-03-02 ENCOUNTER — Ambulatory Visit (HOSPITAL_COMMUNITY): Payer: Medicare HMO | Attending: Cardiovascular Disease

## 2017-03-02 ENCOUNTER — Other Ambulatory Visit: Payer: Self-pay

## 2017-03-02 DIAGNOSIS — I351 Nonrheumatic aortic (valve) insufficiency: Secondary | ICD-10-CM

## 2017-03-02 DIAGNOSIS — I081 Rheumatic disorders of both mitral and tricuspid valves: Secondary | ICD-10-CM | POA: Insufficient documentation

## 2017-03-17 ENCOUNTER — Ambulatory Visit (INDEPENDENT_AMBULATORY_CARE_PROVIDER_SITE_OTHER): Payer: Medicare HMO | Admitting: Cardiology

## 2017-03-17 ENCOUNTER — Encounter: Payer: Self-pay | Admitting: Cardiology

## 2017-03-17 VITALS — BP 126/66 | HR 92 | Ht 63.0 in | Wt 175.0 lb

## 2017-03-17 DIAGNOSIS — I351 Nonrheumatic aortic (valve) insufficiency: Secondary | ICD-10-CM | POA: Diagnosis not present

## 2017-03-17 DIAGNOSIS — I4819 Other persistent atrial fibrillation: Secondary | ICD-10-CM

## 2017-03-17 DIAGNOSIS — I119 Hypertensive heart disease without heart failure: Secondary | ICD-10-CM | POA: Diagnosis not present

## 2017-03-17 DIAGNOSIS — I481 Persistent atrial fibrillation: Secondary | ICD-10-CM

## 2017-03-17 DIAGNOSIS — I482 Chronic atrial fibrillation, unspecified: Secondary | ICD-10-CM

## 2017-03-17 MED ORDER — METOPROLOL SUCCINATE ER 25 MG PO TB24: 25.0000 mg | ORAL_TABLET | Freq: Every day | ORAL | 3 refills | Status: DC

## 2017-03-17 MED ORDER — APIXABAN 5 MG PO TABS: 5.0000 mg | ORAL_TABLET | Freq: Two times a day (BID) | ORAL | 3 refills | Status: DC

## 2017-03-17 NOTE — Progress Notes (Signed)
Cardiology Office Note   Date:  03/17/2017   ID:  Kathryn Chang, DOB Jan 14, 1941, MRN [id number]  PCP:  [name], MD  Cardiologist: Darlin Coco MD --[place]  Chief complain: 6 months follow-up   History of Present Illness: Kathryn Chang is a 76 y.o. female who presents for scheduled follow-up visit. She has a history of established atrial fibrillation and is on long-term Eliquis. The patient had an echocardiogram on 01/01/15 which showed normal left ventricular systolic function with an ejection fraction of 55-60%. There was moderate aortic insufficiency and there was biatrial enlargement. She was switched from digoxin to metoprolol for rate control. Since last visit she's had no new cardiac symptoms. She recently traveled to [place] and they were able to administer to a lot of the local people there.  03/17/2017  - this is 6 months follow-up, [name] she denies any palpitations dizziness or syncope. She gets occasional lower extremity edema. She has no bleeding with Eliquis and is compliant with all her medications. She denies any orthopnea or paroxysmal nocturnal dyspnea chest pain or shortness of breath. She is complaining of gaining weight, she doesn't exercise.   Past Medical History:  Diagnosis Date  . Atrial fibrillation ([place])   . SBE (subacute bacterial endocarditis) prophylaxis candidate    for MR, AI   Past Surgical History:  Procedure Laterality Date  . APPENDECTOMY    . MASTECTOMY  1987   With Reconstruction- Left  . US ECHOCARDIOGRAPHY  10/2010   EF- 55-60%, mild  MR, mild AR   Current Outpatient Prescriptions  Medication Sig Dispense Refill  . acetaminophen (TYLENOL) 500 MG tablet Take 2,000 mg by mouth once as needed (pain.).    Marland Kitchen apixaban (ELIQUIS) 5 MG TABS tablet Take 1 tablet (5 mg total) by mouth 2 (two) times daily. 180 tablet 3  . Calcium Carbonate-Vitamin D (CALCIUM + D PO) Take 600 mg by mouth 2 (two) times  daily.     . Cholecalciferol (VITAMIN D PO) Take 2,000 Units by mouth daily.      [name] Calcium (STOOL SOFTENER PO) Take 1 tablet by mouth as needed (constipation.).     Marland Kitchen famotidine (PEPCID) 20 MG tablet One at bedtime    . MethylPREDNISolone (MEDROL PO) Take by mouth.    . metoprolol succinate (TOPROL-XL) 25 MG 24 hr tablet Take 1 tablet (25 mg total) by mouth daily. 90 tablet 3  . Multiple Vitamin (MULTIVITAMIN PO) Take 1 tablet by mouth daily.     . zoledronic acid (RECLAST) 5 MG/100ML SOLN injection Inject 5 mg into the vein. Yearly     No current facility-administered medications for this visit.     Allergies:   Shellfish-derived products    Social History:  The patient  reports that she has never smoked. She has never used smokeless tobacco.   Family History:  The patient's family history includes Heart disease in her father and mother; Hypertension in her father and mother.    ROS:  Please see the history of present illness.   Otherwise, review of systems are positive for none.   All other systems are reviewed and negative.    PHYSICAL EXAM: VS:  BP 126/66   Pulse 92   Ht 5\' 3"  (1.6 m)   Wt 175 lb (79.4 kg)   BMI 31.00 kg/m  , BMI Body mass index is 31 kg/m. GEN: Well nourished, well developed, in no acute distress  HEENT: normal  Neck: no JVD, carotid bruits, or masses Cardiac: Irregularly irregular.  Mild diastolic murmur of aortic insufficiency.  No gallop., rubs, or ,no edema  Respiratory:  clear to auscultation bilaterally, normal work of breathing GI: soft, nontender, nondistended, + BS MS: no deformity or atrophy  Skin: warm and dry, no rash Neuro:  Strength and sensation are intact Psych: euthymic mood, full affect  EKG:  EKG is not ordered today.  TTE: 12/2014  - Left ventricle: The cavity size was normal. Wall thickness was normal. Systolic function was normal. The estimated ejection fraction was in the range of 55% to 60%. Wall motion was  normal; there were no regional wall motion abnormalities. - Aortic valve: There was moderate regurgitation. - Left atrium: The atrium was severely dilated. - Right atrium: The atrium was moderately dilated. - Pulmonary arteries: Systolic pressure was moderately increased. PA peak pressure: 42 mm Hg (S).  Recent Labs: 08/25/2016: ALT 17; BUN 25; Creat 0.99; Hemoglobin 14.0; Platelets 273; Potassium 4.3; Sodium 139   Lipid Panel No results found for: CHOL, TRIG, HDL, CHOLHDL, VLDL, LDLCALC, LDLDIRECT   Wt Readings from Last 3 Encounters:  03/17/17 175 lb (79.4 kg)  09/01/16 172 lb (78 kg)  08/30/16 172 lb 12.8 oz (78.4 kg)    TTE: 12/2014 - Left ventricle: The cavity size was normal. Wall thickness was normal. Systolic function was normal. The estimated ejection fraction was in the range of 55% to 60%. Wall motion was normal; there were no regional wall motion abnormalities. - Aortic valve: There was moderate regurgitation. - Left atrium: The atrium was severely dilated. - Right atrium: The atrium was moderately dilated. - Pulmonary arteries: Systolic pressure was moderately increased. PA peak pressure: 42 mm Hg (S).  TTE: 03/02/2017 Left ventricle: The cavity size was normal. Systolic function was   normal. The estimated ejection fraction was in the range of 55%   to 60%. Wall motion was normal; there were no regional wall   motion abnormalities. - Aortic valve: Transvalvular velocity was within the normal range.   There was no stenosis. There was mild regurgitation. - Aorta: Ascending aortic diameter: 38 mm (S). - Ascending aorta: The ascending aorta was mildly dilated. - Mitral valve: Transvalvular velocity was within the normal range.   There was no evidence for stenosis. There was mild regurgitation. - Left atrium: The atrium was severely dilated. - Right ventricle: The cavity size was mildly dilated. Wall   thickness was normal. Systolic function was normal. -  Right atrium: The atrium was mildly dilated. - Atrial septum: No defect or patent foramen ovale was identified. - Tricuspid valve: There was mild regurgitation. - Pulmonary arteries: Systolic pressure was within the normal   range. PA peak pressure: 32 mm Hg (S).    ASSESSMENT AND PLAN:  1. Chronic permanent atrial fibrillation with controlled ventricular response. Severe left atrial enlargement on echocardiogram. She is asymptomatic we will continu pelvic rest for anticoagulation. EKG performed today 03/17/2017 was personally reviewed and it shows atrial fibrillation with PVCs otherwise unchanged from prior. 2. Moderate aortic insufficiency by echocardiogram - most recent echocardiogram performed on 03/02/2017 showed only mild aortic regurgitation. Left ventricular size remains normal.  3.Mild regurgitation by echo 4. Hypertension - well controlled.  Follow-up in one year.  Signed, Ena Dawley, MD 03/17/2017 10:33 AM    [facility] [address], [place], [place]  [address] Phone: [phone]; Fax: [phone]

## 2017-03-17 NOTE — Patient Instructions (Signed)

## 2017-05-16 DIAGNOSIS — H353131 Nonexudative age-related macular degeneration, bilateral, early dry stage: Secondary | ICD-10-CM | POA: Diagnosis not present

## 2017-05-16 DIAGNOSIS — H25813 Combined forms of age-related cataract, bilateral: Secondary | ICD-10-CM | POA: Diagnosis not present

## 2017-06-07 DIAGNOSIS — K5730 Diverticulosis of large intestine without perforation or abscess without bleeding: Secondary | ICD-10-CM | POA: Diagnosis not present

## 2017-06-07 DIAGNOSIS — Z86010 Personal history of colonic polyps: Secondary | ICD-10-CM | POA: Diagnosis not present

## 2017-06-22 ENCOUNTER — Encounter: Payer: Self-pay | Admitting: Podiatry

## 2017-06-22 ENCOUNTER — Ambulatory Visit (INDEPENDENT_AMBULATORY_CARE_PROVIDER_SITE_OTHER): Payer: Medicare HMO | Admitting: Podiatry

## 2017-06-22 DIAGNOSIS — M779 Enthesopathy, unspecified: Secondary | ICD-10-CM | POA: Diagnosis not present

## 2017-06-22 MED ORDER — TRIAMCINOLONE ACETONIDE 10 MG/ML IJ SUSP
10.0000 mg | Freq: Once | INTRAMUSCULAR | Status: AC
  Administered 2017-06-22: 10 mg

## 2017-06-22 NOTE — Progress Notes (Signed)
Subjective:    Patient ID: Kathryn Chang, female   DOB: 76 y.o.   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

## 2017-08-30 DIAGNOSIS — M899 Disorder of bone, unspecified: Secondary | ICD-10-CM | POA: Diagnosis not present

## 2017-08-30 DIAGNOSIS — E782 Mixed hyperlipidemia: Secondary | ICD-10-CM | POA: Diagnosis not present

## 2017-08-30 DIAGNOSIS — I4891 Unspecified atrial fibrillation: Secondary | ICD-10-CM | POA: Diagnosis not present

## 2017-08-30 DIAGNOSIS — Z853 Personal history of malignant neoplasm of breast: Secondary | ICD-10-CM | POA: Diagnosis not present

## 2017-08-30 DIAGNOSIS — Z79899 Other long term (current) drug therapy: Secondary | ICD-10-CM | POA: Diagnosis not present

## 2017-08-30 DIAGNOSIS — Z0000 Encounter for general adult medical examination without abnormal findings: Secondary | ICD-10-CM | POA: Diagnosis not present

## 2017-08-30 DIAGNOSIS — M810 Age-related osteoporosis without current pathological fracture: Secondary | ICD-10-CM | POA: Diagnosis not present

## 2017-08-30 DIAGNOSIS — R7301 Impaired fasting glucose: Secondary | ICD-10-CM | POA: Diagnosis not present

## 2017-08-30 DIAGNOSIS — Z86010 Personal history of colonic polyps: Secondary | ICD-10-CM | POA: Diagnosis not present

## 2017-09-23 ENCOUNTER — Ambulatory Visit (HOSPITAL_COMMUNITY)
Admission: RE | Admit: 2017-09-23 | Discharge: 2017-09-23 | Disposition: A | Discharge status: Home / Self Care | Payer: Medicare HMO | Source: Ambulatory Visit | Attending: Family Medicine | Admitting: Family Medicine

## 2017-09-23 ENCOUNTER — Encounter (HOSPITAL_COMMUNITY): Payer: Self-pay

## 2017-09-23 DIAGNOSIS — M810 Age-related osteoporosis without current pathological fracture: Secondary | ICD-10-CM | POA: Diagnosis not present

## 2017-09-23 MED ORDER — ZOLEDRONIC ACID 5 MG/100ML IV SOLN
5.0000 mg | Freq: Once | INTRAVENOUS | Status: AC
  Administered 2017-09-23: 5 mg via INTRAVENOUS
  Filled 2017-09-23: qty 100

## 2017-09-23 MED ORDER — SODIUM CHLORIDE 0.9 % IV SOLN
INTRAVENOUS | Status: AC
  Administered 2017-09-23: 13:00:00 via INTRAVENOUS

## 2017-09-23 NOTE — Discharge Instructions (Signed)
Drink  Fluids/water as tolerated over the next 72 hours Tylenol or ibuprofen if needed for aches and pains next few days  Continue Calcium and Vit D as directed by your MD  911 for emergencies  Call your MD for any problems or questions.    Zoledronic Acid injection (Paget's Disease, Osteoporosis) What is this medicine? ZOLEDRONIC ACID (ZOE le dron ik AS id) lowers the amount of calcium loss from bone. It is used to treat Paget's disease and osteoporosis in women. This medicine may be used for other purposes; ask your health care provider or pharmacist if you have questions. COMMON BRAND NAME(S): Reclast, Zometa What should I tell my health care provider before I take this medicine? They need to know if you have any of these conditions: -aspirin-sensitive asthma -cancer, especially if you are receiving medicines used to treat cancer -dental disease or wear dentures -infection -kidney disease -low levels of calcium in the blood -past surgery on the parathyroid gland or intestines -receiving corticosteroids like dexamethasone or prednisone -an unusual or allergic reaction to zoledronic acid, other medicines, foods, dyes, or preservatives -pregnant or trying to get pregnant -breast-feeding How should I use this medicine? This medicine is for infusion into a vein. It is given by a health care professional in a hospital or clinic setting. Talk to your pediatrician regarding the use of this medicine in children. This medicine is not approved for use in children. Overdosage: If you think you have taken too much of this medicine contact a poison control center or emergency room at once. NOTE: This medicine is only for you. Do not share this medicine with others. What if I miss a dose? It is important not to miss your dose. Call your doctor or health care professional if you are unable to keep an appointment. What may interact with this medicine? -certain antibiotics given by  injection -NSAIDs, medicines for pain and inflammation, like ibuprofen or naproxen -some diuretics like bumetanide, furosemide -teriparatide This list may not describe all possible interactions. Give your health care provider a list of all the medicines, herbs, non-prescription drugs, or dietary supplements you use. Also tell them if you smoke, drink alcohol, or use illegal drugs. Some items may interact with your medicine. What should I watch for while using this medicine? Visit your doctor or health care professional for regular checkups. It may be some time before you see the benefit from this medicine. Do not stop taking your medicine unless your doctor tells you to. Your doctor may order blood tests or other tests to see how you are doing. Women should inform their doctor if they wish to become pregnant or think they might be pregnant. There is a potential for serious side effects to an unborn child. Talk to your health care professional or pharmacist for more information. You should make sure that you get enough calcium and vitamin D while you are taking this medicine. Discuss the foods you eat and the vitamins you take with your health care professional. Some people who take this medicine have severe bone, joint, and/or muscle pain. This medicine may also increase your risk for jaw problems or a broken thigh bone. Tell your doctor right away if you have severe pain in your jaw, bones, joints, or muscles. Tell your doctor if you have any pain that does not go away or that gets worse. Tell your dentist and dental surgeon that you are taking this medicine. You should not have major dental surgery while  on this medicine. See your dentist to have a dental exam and fix any dental problems before starting this medicine. Take good care of your teeth while on this medicine. Make sure you see your dentist for regular follow-up appointments. What side effects may I notice from receiving this medicine? Side  effects that you should report to your doctor or health care professional as soon as possible: -allergic reactions like skin rash, itching or hives, swelling of the face, lips, or tongue -anxiety, confusion, or depression -breathing problems -changes in vision -eye pain -feeling faint or lightheaded, falls -jaw pain, especially after dental work -mouth sores -muscle cramps, stiffness, or weakness -redness, blistering, peeling or loosening of the skin, including inside the mouth -trouble passing urine or change in the amount of urine Side effects that usually do not require medical attention (report to your doctor or health care professional if they continue or are bothersome): -bone, joint, or muscle pain -constipation -diarrhea -fever -hair loss -irritation at site where injected -loss of appetite -nausea, vomiting -stomach upset -trouble sleeping -trouble swallowing -weak or tired This list may not describe all possible side effects. Call your doctor for medical advice about side effects. You may report side effects to FDA at 1-800-FDA-1088. Where should I keep my medicine? This drug is given in a hospital or clinic and will not be stored at home. NOTE: This sheet is a summary. It may not cover all possible information. If you have questions about this medicine, talk to your doctor, pharmacist, or health care provider.  2018 Elsevier/Gold Standard (2014-03-09 14:19:57)

## 2017-10-06 DIAGNOSIS — Z853 Personal history of malignant neoplasm of breast: Secondary | ICD-10-CM | POA: Diagnosis not present

## 2017-10-06 DIAGNOSIS — Z1231 Encounter for screening mammogram for malignant neoplasm of breast: Secondary | ICD-10-CM | POA: Diagnosis not present

## 2017-11-17 ENCOUNTER — Ambulatory Visit
Admission: RE | Admit: 2017-11-17 | Discharge: 2017-11-17 | Disposition: A | Discharge status: Home / Self Care | Payer: Medicare HMO | Source: Ambulatory Visit | Attending: Family Medicine | Admitting: Family Medicine

## 2017-11-17 ENCOUNTER — Other Ambulatory Visit: Payer: Self-pay | Admitting: Family Medicine

## 2017-11-17 DIAGNOSIS — N200 Calculus of kidney: Secondary | ICD-10-CM | POA: Diagnosis not present

## 2017-11-17 DIAGNOSIS — R319 Hematuria, unspecified: Secondary | ICD-10-CM

## 2017-11-17 DIAGNOSIS — E669 Obesity, unspecified: Secondary | ICD-10-CM | POA: Diagnosis not present

## 2017-11-17 DIAGNOSIS — Z6830 Body mass index (BMI) 30.0-30.9, adult: Secondary | ICD-10-CM | POA: Diagnosis not present

## 2017-12-01 DIAGNOSIS — R8290 Unspecified abnormal findings in urine: Secondary | ICD-10-CM | POA: Diagnosis not present

## 2017-12-07 ENCOUNTER — Inpatient Hospital Stay (HOSPITAL_COMMUNITY)
Admission: EM | Admit: 2017-12-07 | Discharge: 2017-12-12 | DRG: 871 | Disposition: A | Discharge status: Home / Self Care | Payer: Medicare HMO | Attending: Internal Medicine | Admitting: Internal Medicine

## 2017-12-07 ENCOUNTER — Encounter (HOSPITAL_COMMUNITY): Payer: Self-pay

## 2017-12-07 ENCOUNTER — Emergency Department (HOSPITAL_COMMUNITY): Payer: Medicare HMO

## 2017-12-07 DIAGNOSIS — K219 Gastro-esophageal reflux disease without esophagitis: Secondary | ICD-10-CM | POA: Diagnosis present

## 2017-12-07 DIAGNOSIS — I351 Nonrheumatic aortic (valve) insufficiency: Secondary | ICD-10-CM | POA: Diagnosis not present

## 2017-12-07 DIAGNOSIS — Z79899 Other long term (current) drug therapy: Secondary | ICD-10-CM

## 2017-12-07 DIAGNOSIS — Z888 Allergy status to other drugs, medicaments and biological substances status: Secondary | ICD-10-CM | POA: Diagnosis not present

## 2017-12-07 DIAGNOSIS — R5383 Other fatigue: Secondary | ICD-10-CM | POA: Diagnosis not present

## 2017-12-07 DIAGNOSIS — J181 Lobar pneumonia, unspecified organism: Secondary | ICD-10-CM

## 2017-12-07 DIAGNOSIS — J1008 Influenza due to other identified influenza virus with other specified pneumonia: Secondary | ICD-10-CM | POA: Diagnosis not present

## 2017-12-07 DIAGNOSIS — I10 Essential (primary) hypertension: Secondary | ICD-10-CM | POA: Diagnosis not present

## 2017-12-07 DIAGNOSIS — J189 Pneumonia, unspecified organism: Secondary | ICD-10-CM | POA: Diagnosis present

## 2017-12-07 DIAGNOSIS — Z91013 Allergy to seafood: Secondary | ICD-10-CM | POA: Diagnosis not present

## 2017-12-07 DIAGNOSIS — R05 Cough: Secondary | ICD-10-CM | POA: Diagnosis not present

## 2017-12-07 DIAGNOSIS — R0602 Shortness of breath: Secondary | ICD-10-CM

## 2017-12-07 DIAGNOSIS — J988 Other specified respiratory disorders: Secondary | ICD-10-CM | POA: Diagnosis not present

## 2017-12-07 DIAGNOSIS — I4891 Unspecified atrial fibrillation: Secondary | ICD-10-CM | POA: Diagnosis present

## 2017-12-07 DIAGNOSIS — B59 Pneumocystosis: Secondary | ICD-10-CM | POA: Diagnosis not present

## 2017-12-07 DIAGNOSIS — Z7983 Long term (current) use of bisphosphonates: Secondary | ICD-10-CM | POA: Diagnosis not present

## 2017-12-07 DIAGNOSIS — Z7901 Long term (current) use of anticoagulants: Secondary | ICD-10-CM

## 2017-12-07 DIAGNOSIS — A419 Sepsis, unspecified organism: Principal | ICD-10-CM | POA: Diagnosis present

## 2017-12-07 DIAGNOSIS — J9811 Atelectasis: Secondary | ICD-10-CM | POA: Diagnosis not present

## 2017-12-07 DIAGNOSIS — B974 Respiratory syncytial virus as the cause of diseases classified elsewhere: Secondary | ICD-10-CM | POA: Diagnosis present

## 2017-12-07 DIAGNOSIS — E872 Acidosis: Secondary | ICD-10-CM | POA: Diagnosis present

## 2017-12-07 DIAGNOSIS — J129 Viral pneumonia, unspecified: Secondary | ICD-10-CM | POA: Diagnosis present

## 2017-12-07 DIAGNOSIS — J209 Acute bronchitis, unspecified: Secondary | ICD-10-CM | POA: Diagnosis present

## 2017-12-07 DIAGNOSIS — J9601 Acute respiratory failure with hypoxia: Secondary | ICD-10-CM | POA: Diagnosis present

## 2017-12-07 DIAGNOSIS — R000 Tachycardia, unspecified: Secondary | ICD-10-CM | POA: Diagnosis not present

## 2017-12-07 DIAGNOSIS — I340 Nonrheumatic mitral (valve) insufficiency: Secondary | ICD-10-CM | POA: Diagnosis not present

## 2017-12-07 HISTORY — DX: Pneumonia, unspecified organism: J18.9

## 2017-12-07 HISTORY — DX: Unspecified osteoarthritis, unspecified site: M19.90

## 2017-12-07 HISTORY — DX: Malignant neoplasm of unspecified site of left female breast: C50.912

## 2017-12-07 LAB — URINALYSIS, ROUTINE W REFLEX MICROSCOPIC
BILIRUBIN URINE: NEGATIVE
GLUCOSE, UA: NEGATIVE mg/dL
Ketones, ur: 20 mg/dL — AB
LEUKOCYTES UA: NEGATIVE
NITRITE: NEGATIVE
PH: 6 (ref 5.0–8.0)
Protein, ur: NEGATIVE mg/dL
SPECIFIC GRAVITY, URINE: 1.003 — AB (ref 1.005–1.030)

## 2017-12-07 LAB — BASIC METABOLIC PANEL
Anion gap: 13 (ref 5–15)
BUN: 9 mg/dL (ref 6–20)
CHLORIDE: 101 mmol/L (ref 101–111)
CO2: 21 mmol/L — AB (ref 22–32)
CREATININE: 0.92 mg/dL (ref 0.44–1.00)
Calcium: 8.8 mg/dL — ABNORMAL LOW (ref 8.9–10.3)
GFR calc Af Amer: >60 mL/min (ref >60)
GFR calc non Af Amer: 60 mL/min — ABNORMAL LOW (ref >60)
GLUCOSE: 128 mg/dL — AB (ref 65–99)
Potassium: 3.7 mmol/L (ref 3.5–5.1)
SODIUM: 135 mmol/L (ref 135–145)

## 2017-12-07 LAB — CBC
HEMATOCRIT: 43.3 % (ref 36.0–46.0)
Hemoglobin: 14.7 g/dL (ref 12.0–15.0)
MCH: 31.3 pg (ref 26.0–34.0)
MCHC: 33.9 g/dL (ref 30.0–36.0)
MCV: 92.1 fL (ref 78.0–100.0)
PLATELETS: 265 10*3/uL (ref 150–400)
RBC: 4.7 MIL/uL (ref 3.87–5.11)
RDW: 13.9 % (ref 11.5–15.5)
WBC: 9.2 10*3/uL (ref 4.0–10.5)

## 2017-12-07 LAB — INFLUENZA PANEL BY PCR (TYPE A & B)
INFLAPCR: NEGATIVE
Influenza B By PCR: NEGATIVE

## 2017-12-07 LAB — PROTIME-INR
INR: 1.2
Prothrombin Time: 15.1 seconds (ref 11.4–15.2)

## 2017-12-07 LAB — I-STAT CG4 LACTIC ACID, ED
LACTIC ACID, VENOUS: 2.37 mmol/L — AB (ref 0.5–1.9)
Lactic Acid, Venous: 2.46 mmol/L (ref 0.5–1.9)

## 2017-12-07 MED ORDER — IPRATROPIUM-ALBUTEROL 0.5-2.5 (3) MG/3ML IN SOLN
3.0000 mL | Freq: Once | RESPIRATORY_TRACT | Status: AC
Start: 2017-12-07 — End: 2017-12-07
  Administered 2017-12-07: 3 mL via RESPIRATORY_TRACT
  Filled 2017-12-07: qty 3

## 2017-12-07 MED ORDER — ACETAMINOPHEN 325 MG PO TABS
650.0000 mg | ORAL_TABLET | Freq: Once | ORAL | Status: AC
Start: 2017-12-07 — End: 2017-12-07
  Administered 2017-12-07: 650 mg via ORAL
  Filled 2017-12-07: qty 2

## 2017-12-07 MED ORDER — DILTIAZEM LOAD VIA INFUSION
10.0000 mg | Freq: Once | INTRAVENOUS | Status: AC
  Administered 2017-12-08: 10 mg via INTRAVENOUS
  Filled 2017-12-07: qty 10

## 2017-12-07 MED ORDER — DILTIAZEM HCL-DEXTROSE 100-5 MG/100ML-% IV SOLN (PREMIX)
5.0000 mg/h | INTRAVENOUS | Status: DC
  Filled 2017-12-07: qty 100

## 2017-12-07 MED ORDER — SODIUM CHLORIDE 0.9 % IV BOLUS (SEPSIS)
1000.0000 mL | Freq: Once | INTRAVENOUS | Status: AC
  Administered 2017-12-07: 1000 mL via INTRAVENOUS

## 2017-12-07 MED ORDER — SODIUM CHLORIDE 0.9 % IV BOLUS (SEPSIS)
500.0000 mL | Freq: Once | INTRAVENOUS | Status: AC
  Administered 2017-12-08: 500 mL via INTRAVENOUS

## 2017-12-07 MED ORDER — CEFTRIAXONE SODIUM 1 G IJ SOLR
1.0000 g | Freq: Once | INTRAMUSCULAR | Status: AC
  Administered 2017-12-07: 1 g via INTRAVENOUS
  Filled 2017-12-07: qty 10

## 2017-12-07 MED ORDER — SODIUM CHLORIDE 0.9 % IV SOLN
1.0000 g | INTRAVENOUS | Status: DC
  Filled 2017-12-07: qty 10

## 2017-12-07 MED ORDER — SODIUM CHLORIDE 0.9 % IV SOLN
500.0000 mg | INTRAVENOUS | Status: DC
  Filled 2017-12-07: qty 500

## 2017-12-07 MED ORDER — AZITHROMYCIN 250 MG PO TABS
500.0000 mg | ORAL_TABLET | Freq: Once | ORAL | Status: AC
  Administered 2017-12-07: 500 mg via ORAL
  Filled 2017-12-07: qty 2

## 2017-12-07 NOTE — ED Notes (Addendum)
Pt sats 87% on RA; placed on 3L [place]. Sats now 94%. EDP made aware, pt not ambulated at this time.

## 2017-12-07 NOTE — ED Provider Notes (Signed)
[facility] EMERGENCY DEPARTMENT Provider Note   CSN: [id number] Arrival date & time: 12/07/17  1757     History   Chief Complaint Chief Complaint  Patient presents with  . Atrial Fibrillation    HPI Kathryn Chang is a 77 y.o. female. 77 year old female with history of A. fib on anticoagulation sent in from her primary care doctor here today for an elevated heart rate in the setting of a illness.  Patient states she  started with a cough on Saturday progressed through the last few days.  She did not know she is febrile or not.  Today she went to her primary care doctor with a told she has a flu and a pneumonia and her heart rate was in the 150s.  She denies any chest pain was not aware that her heart rate was elevated.  There is no chest pain.  She states she feels terrible otherwise, with generalized malaise decreased p.o. intake, cough.  No sick contacts.    The history is provided by the patient.  Atrial Fibrillation  This is a chronic problem. Associated symptoms include shortness of breath. Pertinent negatives include no chest pain, no abdominal pain and no headaches. Nothing aggravates the symptoms. She has tried nothing for the symptoms. The treatment provided no relief.    Past Medical History:  Diagnosis Date  . Atrial fibrillation ([place])   . SBE (subacute bacterial endocarditis) prophylaxis candidate    for MR, AI    Patient Active Problem List   Diagnosis Date Noted  . Abnormal CXR 05/09/2016  . Upper airway cough syndrome 05/04/2016  . Atrial fibrillation, persistent ([place]) 03/03/2016  . Chronic atrial fibrillation ([place]) 03/03/2016  . AI (aortic insufficiency) 03/03/2016  . Hypertensive heart disease 03/03/2016  . Long term current use of anticoagulant therapy 03/03/2016  . Aortic insufficiency 05/05/2015  . Encounter for therapeutic drug monitoring 11/21/2013  . Mitral regurgitation 01/22/2013  . Atrial fibrillation ([place]) 03/09/2011    . Long term (current) use of anticoagulants 03/09/2011    Past Surgical History:  Procedure Laterality Date  . APPENDECTOMY    . MASTECTOMY  1987   With Reconstruction- Left  . US ECHOCARDIOGRAPHY  10/2010   EF- 55-60%, mild  MR, mild AR    OB History    No data available       Home Medications    Prior to Admission medications   Medication Sig Start Date End Date Taking? Authorizing Provider  acetaminophen (TYLENOL) 500 MG tablet Take 2,000 mg by mouth once as needed (pain.).    [provider]  apixaban (ELIQUIS) 5 MG TABS tablet Take 1 tablet (5 mg total) by mouth 2 (two) times daily. 03/17/17   Dorothy Spark, MD  Calcium Carbonate-Vitamin D (CALCIUM + D PO) Take 600 mg by mouth 2 (two) times daily.     [provider]  Cholecalciferol (VITAMIN D PO) Take 2,000 Units by mouth daily.      [provider]  Docusate Calcium (STOOL SOFTENER PO) Take 1 tablet by mouth as needed (constipation.).     [provider]  famotidine (PEPCID) 20 MG tablet One at bedtime 05/04/16   Tanda Rockers, MD  MethylPREDNISolone (MEDROL PO) Take by mouth.    [provider]  metoprolol succinate (TOPROL-XL) 25 MG 24 hr tablet Take 1 tablet (25 mg total) by mouth daily. 03/17/17   Dorothy Spark, MD  Multiple Vitamin (MULTIVITAMIN PO) Take 1 tablet by  mouth daily.     [provider]  NON So-Hi  Anti-Inflammatory Cream- Diclofenac 3%, Baclofen 2%, Lidocaine 2% Apply 1-2 grams to affected area 3-4 times daily Qty. 120 gm 3 refills    [provider]  zoledronic acid (RECLAST) 5 MG/100ML SOLN injection Inject 5 mg into the vein. Yearly    [provider]    Family History Family History  Problem Relation Age of Onset  . Hypertension Father   . Heart disease Father   . Heart disease Mother   . Hypertension Mother     Social History Social History   Tobacco Use  . Smoking status: Never  Smoker  . Smokeless tobacco: Never Used  Substance Use Topics  . Alcohol use: Yes    Alcohol/week: 0.0 oz    Comment: occasional   . Drug use: No     Allergies   Shellfish-derived products and Tamiflu [oseltamivir phosphate]   Review of Systems Review of Systems  Constitutional: Positive for fatigue and fever. Negative for chills.  HENT: Negative for ear pain and sore throat.   Eyes: Negative for pain and visual disturbance.  Respiratory: Positive for cough and shortness of breath.   Cardiovascular: Negative for chest pain, palpitations and leg swelling.  Gastrointestinal: Negative for abdominal pain, nausea and vomiting.  Genitourinary: Negative for dysuria, frequency and hematuria.  Musculoskeletal: Negative for arthralgias, back pain and neck pain.  Skin: Negative for color change and rash.  Neurological: Negative for seizures, syncope and headaches.  All other systems reviewed and are negative.    Physical Exam Updated Vital Signs BP (!) 147/84 (BP Location: Right Arm)   Pulse (!) 145   Temp (!) 100.7 F (38.2 C) (Oral)   Resp 19   Ht 5\' 3"  (1.6 m)   Wt 77.1 kg (170 lb)   SpO2 92%   BMI 30.11 kg/m   Physical Exam  Constitutional: She appears well-developed and well-nourished. No distress.  HENT:  Head: Normocephalic and atraumatic.  Eyes: Conjunctivae are normal.  Neck: Neck supple.  Cardiovascular: Normal heart sounds and normal pulses. An irregularly irregular rhythm present. Tachycardia present. Exam reveals no decreased pulses.  No murmur heard. Pulmonary/Chest: Effort normal. No respiratory distress. She has rhonchi in the right upper field, the right middle field, the right lower field, the left upper field, the left middle field and the left lower field.  Abdominal: Soft. There is no tenderness.  Musculoskeletal: She exhibits no edema.  Neurological: She is alert.  Skin: Skin is warm and dry.  Psychiatric: She has a normal mood and affect.  Nursing  note and vitals reviewed.    ED Treatments / Results  Labs (all labs ordered are listed, but only abnormal results are displayed) Labs Reviewed  CULTURE, BLOOD (ROUTINE X 2)  CULTURE, BLOOD (ROUTINE X 2)  BASIC METABOLIC PANEL  CBC  PROTIME-INR  INFLUENZA PANEL BY PCR (TYPE A & B)  I-STAT CG4 LACTIC ACID, ED    EKG  EKG Interpretation  Date/Time:  Wednesday December 07 2017 18:00:36 EST Ventricular Rate:  135 PR Interval:    QRS Duration: 74 QT Interval:  312 QTC Calculation: 468 R Axis:   55 Text Interpretation:  afib with rvr Anteroseptal infarct, old Minimal ST depression, inferior leads Confirmed by Aletta Edouard [phone]) on 12/07/2017 6:36:23 PM       Radiology Dg Chest 2 View  Result Date: 12/07/2017 CLINICAL DATA:  Cough, flu-like symptoms EXAM: CHEST  2 VIEW  COMPARISON:  12/07/2017 FINDINGS: Heart and mediastinal contours are within normal limits. No focal opacities or effusions. No acute bony abnormality. Surgical clips in the left axilla. IMPRESSION: No active cardiopulmonary disease. Electronically Signed   By: Rolm Baptise M.D.   On: 12/07/2017 18:56    Procedures Procedures (including critical care time)  Medications Ordered in ED Medications  sodium chloride 0.9 % bolus 1,000 mL (1,000 mLs Intravenous New Bag/Given 12/07/17 1854)  acetaminophen (TYLENOL) tablet 650 mg (650 mg Oral Given 12/07/17 1854)     Initial Impression / Assessment and Plan / ED Course  I have reviewed the triage vital signs and the nursing notes.  Pertinent labs & imaging results that were available during my care of the patient were reviewed by me and considered in my medical decision making (see chart for details).  Clinical Course as of Dec 08 1106  Wed Dec 07, 6778  9228 77 year old with chronic A. fib usually rate control beta-blocker here with a respiratory illness fever cough with diffuse rhonchi on lung exam.  Her chest x-ray did not show an obvious pneumonia and her  white count is normal.  Her lactate is mildly elevated.  With Tylenol for fever and IV fluids her heart rates come down close to 100 or 110 from 150s.  We will give her another liter of fluid and a breathing treatment and we are repeating her lactic acid.  If these are normalized and she is feeling better potentially she would be a candidate for trending pulse ox and possibly home.  If not she may need to come in for further management.  [MB]  2219 Discussed with medicine hospitalist Dr. Roel Cluck who accepts to her service.  She does recommend that we start her on some Cap coverage and so I have ordered ceftriaxone and Zithromax.  Patient has a new oxygen requirement after having been borderline for most of her stay here in the emergency department.  We were unable to turn her that she is already requiring oxygen.  I reviewed her workup with the patient and the fact that she had cleared her lactate and she is agreeable for admission.  [MB]    Clinical Course User Index [MB] Hayden Rasmussen, MD      Final Clinical Impressions(s) / ED Diagnoses   Final diagnoses:  Atrial fibrillation with [facility])  Respiratory infection    ED Discharge Orders    None       Hayden Rasmussen, MD 12/08/17 1112

## 2017-12-07 NOTE — ED Notes (Signed)
Pt. Using bedside commode.

## 2017-12-07 NOTE — ED Notes (Signed)
Patient transported to X-ray 

## 2017-12-07 NOTE — ED Triage Notes (Signed)
Pt from PCP for a fib rvr, pt went to PCP for persistent cough and fever since Saturday. Pt was diagnosed with influenza A and bacterial pneumonia in [place] while at the office and was found to be in A fib with a heart rate of 100-150bpm. Pt has hx of a fib and is taking eliquis. Pt denies any CP. Other VSS, pt a.o, nad

## 2017-12-07 NOTE — H&P (Signed)
Kathryn Chang XTK:[id number] DOB: Jan 28, 1941 DOA: 12/07/2017     PCP: [name], MD   Outpatient Specialists: Cardiology [name], Pulmonology Wert Patient coming from:   home Lives   With family    Chief Complaint: cough  HPI: Kathryn Chang is a 77 y.o. female with medical history significant of a.fib on Eliquis, moderate aortic insufficiency    Presented with 4-5-day history of cough and intermittent fevers presented today to her primary care provider states she was diagnosed with influenza A in the office and had a chest x-ray done showed right lower lobe pneumonia. She did have a flu shot this year.  While at the office he had been noted to have A. fib with RVR with heart rates as high as 150s this was not associated with any chest pain patient was sent to emergency department [facility]. She denies palpitation, no syncope. She have not had much PO intake a home  Had a tamiflu last year and developed rash.    While in ER: Patient was given 2 L of normal saline with heart rate coming down to mid 110s initially no oxygen requirement but then patient developed at least 2 L oxygen requirement and worsening cough.  She was given breathing treatment no improvement  Significant initial  Findings: Lactic acid initially 2.46 now down to 2.37 Influenza A and B negative Creatinine 0.92 BC 9.2 hemoglobin 14.7 CXR in ER no active cardiopulmonary disease but findings obscured by leads Prior to that CXR today did show RLL pneumonia  IN ER:  Temp (24hrs), Avg:100.7 F (38.2 C), Min:100.7 F (38.2 C), Max:100.7 F (38.2 C)      on arrival  ED Triage Vitals  Enc Vitals Group     BP 12/07/17 1801 (!) 147/84     Pulse Rate 12/07/17 1801 (!) 145     Resp 12/07/17 1801 19     Temp 12/07/17 1801 (!) 100.7 F (38.2 C)     Temp Source 12/07/17 1801 Oral     SpO2 12/07/17 1800 97 %     Weight 12/07/17 1811 170 lb (77.1 kg)     Height 12/07/17 1811 5\' 3"  (1.6 m)     Head  Circumference --      Peak Flow --      Pain Score --      Pain Loc --      Pain Edu? --      Excl. in [place]? --     Latest RR 23 91% HR 117 BP 119/55  Following Medications were ordered in ER: Medications  sodium chloride 0.9 % bolus 1,000 mL (0 mLs Intravenous Stopped 12/07/17 1951)  acetaminophen (TYLENOL) tablet 650 mg (650 mg Oral Given 12/07/17 1854)  sodium chloride 0.9 % bolus 1,000 mL (0 mLs Intravenous Stopped 12/07/17 2042)  ipratropium-albuterol (DUONEB) 0.5-2.5 (3) MG/3ML nebulizer solution 3 mL (3 mLs Nebulization Given 12/07/17 1955)     Hospitalist was called for admission for sepsis likely secondary to viral illness versus early pneumonia  Regarding pertinent Chronic problems: Known history of atrial fibrillation followed by cardiology on long-term Eliquis.  Last echogram was done in 2018 showing preserved EF and moderate aortic insufficiency with biatrial enlargement. Heart rate is controlled with  metoprolol  Review of Systems:    Pertinent positives include:  Fevers, chills, fatigue, shortness of breath at rest. excess mucus, productive cough, Constitutional:  No weight loss, night sweats,, weight loss  HEENT:  No headaches, Difficulty swallowing,Tooth/dental  problems,Sore throat,  No sneezing, itching, ear ache, nasal congestion, post nasal drip,  Cardio-vascular:  No chest pain, Orthopnea, PND, anasarca, dizziness, palpitations.no Bilateral lower extremity swelling  GI:  No heartburn, indigestion, abdominal pain, nausea, vomiting, diarrhea, change in bowel habits, loss of appetite, melena, blood in stool, hematemesis Resp:  no  No dyspnea on exertion,   No coughing up of blood.No change in color of mucus.No wheezing. Skin:  no rash or lesions. No jaundice GU:  no dysuria, change in color of urine, no urgency or frequency. No straining to urinate.  No flank pain.  Musculoskeletal:  No joint pain or no joint swelling. No decreased range of motion. No back pain.   Psych:  No change in mood or affect. No depression or anxiety. No memory loss.  Neuro: no localizing neurological complaints, no tingling, no weakness, no double vision, no gait abnormality, no slurred speech, no confusion  As per HPI otherwise 10 point review of systems negative.   Past Medical History: Past Medical History:  Diagnosis Date  . Atrial fibrillation ([place])   . SBE (subacute bacterial endocarditis) prophylaxis candidate    for MR, AI   Past Surgical History:  Procedure Laterality Date  . APPENDECTOMY    . MASTECTOMY  1987   With [place]- Left  . US ECHOCARDIOGRAPHY  10/2010   EF- 55-60%, mild  MR, mild AR     Social History:  Ambulatory   independently      reports that  has never smoked. she has never used smokeless tobacco. She reports that she drinks alcohol. She reports that she does not use drugs.  Allergies:   Allergies  Allergen Reactions  . Shellfish-Derived Products Anaphylaxis    Pass out and incontinent   . Tamiflu [Oseltamivir Phosphate]     Total body rash       Family History:   Family History  Problem Relation Age of Onset  . Hypertension Father   . Heart disease Father   . Heart disease Mother   . Hypertension Mother     Medications: Prior to Admission medications   Medication Sig Start Date End Date Taking? Authorizing Provider  acetaminophen (TYLENOL) 500 MG tablet Take 2,000 mg by mouth once as needed (pain.).    [provider]  apixaban (ELIQUIS) 5 MG TABS tablet Take 1 tablet (5 mg total) by mouth 2 (two) times daily. 03/17/17   Dorothy Spark, MD  Calcium Carbonate-Vitamin D (CALCIUM + D PO) Take 600 mg by mouth 2 (two) times daily.     [provider]  Cholecalciferol (VITAMIN D PO) Take 2,000 Units by mouth daily.      [provider]  Docusate Calcium (STOOL SOFTENER PO) Take 1 tablet by mouth as needed (constipation.).     [provider]  famotidine (PEPCID) 20 MG tablet  One at bedtime 05/04/16   Tanda Rockers, MD  MethylPREDNISolone (MEDROL PO) Take by mouth.    [provider]  metoprolol succinate (TOPROL-XL) 25 MG 24 hr tablet Take 1 tablet (25 mg total) by mouth daily. 03/17/17   Dorothy Spark, MD  Multiple Vitamin (MULTIVITAMIN PO) Take 1 tablet by mouth daily.     [provider]  NON [place]  Anti-Inflammatory Cream- Diclofenac 3%, Baclofen 2%, Lidocaine 2% Apply 1-2 grams to affected area 3-4 times daily Qty. 120 gm 3 refills    [provider]  zoledronic acid (RECLAST) 5 MG/100ML SOLN injection Inject 5  mg into the vein. Yearly    [provider]    Physical Exam: Patient Vitals for the past 24 hrs:  BP Temp Temp src Pulse Resp SpO2 Height Weight  12/07/17 2100 (!) 119/55 - - (!) 117 (!) 23 91 % - -  12/07/17 2000 (!) 113/56 - - 97 14 98 % - -  12/07/17 1934 100/90 - - (!) 110 19 90 % - -  12/07/17 1811 - - - - - - 5\' 3"  (1.6 m) 77.1 kg (170 lb)  12/07/17 1801 (!) 147/84 (!) 100.7 F (38.2 C) Oral (!) 145 19 92 % - -  12/07/17 1800 - - - - - 97 % - -    1. General:  in No Acute distress  well -appearing 2. Psychological: Alert and   Oriented 3. Head/ENT:     Dry Mucous Membranes                          Head Non traumatic, neck supple                           Poor Dentition 4. SKIN:   decreased Skin turgor,  Skin clean Dry and intact no rash 5. Heart: Regular rate and rhythm   Murmur, no Rub or gallop 6. Lungs:  no wheezes coarse crackles throughout  7. Abdomen: Soft non-tender, Non distended   8. Lower extremities: no clubbing, cyanosis, or edema 9. Neurologically Grossly intact, moving all 4 extremities equally  10. MSK: Normal range of motion   body mass index is 30.11 kg/m.  Labs on Admission:   Labs on Admission: I have personally reviewed following labs and imaging studies  CBC: Recent Labs  Lab 12/07/17 1808  WBC 9.2  HGB 14.7  HCT 43.3  MCV 92.1    PLT 725   Basic Metabolic Panel: Recent Labs  Lab 12/07/17 1808  NA 135  K 3.7  CL 101  CO2 21*  GLUCOSE 128*  BUN 9  CREATININE 0.92  CALCIUM 8.8*   GFR: Estimated Creatinine Clearance: 52.8 mL/min (by C-G formula based on SCr of 0.92 mg/dL). Liver Function Tests: No results for input(s): AST, ALT, ALKPHOS, BILITOT, PROT, ALBUMIN in the last 168 hours. No results for input(s): LIPASE, AMYLASE in the last 168 hours. No results for input(s): AMMONIA in the last 168 hours. Coagulation Profile: Recent Labs  Lab 12/07/17 1808  INR 1.20   Cardiac Enzymes: No results for input(s): CKTOTAL, CKMB, CKMBINDEX, TROPONINI in the last 168 hours. BNP (last 3 results) No results for input(s): PROBNP in the last 8760 hours. HbA1C: No results for input(s): HGBA1C in the last 72 hours. CBG: No results for input(s): GLUCAP in the last 168 hours. Lipid Profile: No results for input(s): CHOL, HDL, LDLCALC, TRIG, CHOLHDL, LDLDIRECT in the last 72 hours. Thyroid Function Tests: No results for input(s): TSH, T4TOTAL, FREET4, T3FREE, THYROIDAB in the last 72 hours. Anemia Panel: No results for input(s): VITAMINB12, FOLATE, FERRITIN, TIBC, IRON, RETICCTPCT in the last 72 hours. Urine analysis: No results found for: COLORURINE, APPEARANCEUR, LABSPEC, PHURINE, GLUCOSEU, HGBUR, BILIRUBINUR, KETONESUR, PROTEINUR, UROBILINOGEN, NITRITE, LEUKOCYTESUR Sepsis Labs: @LABRCNTIP (procalcitonin:4,lacticidven:4) )No results found for this or any previous visit (from the past 240 hour(s)).    UA   ordered  No results found for: HGBA1C  Estimated Creatinine Clearance: 52.8 mL/min (by C-G formula based on SCr of 0.92 mg/dL).  BNP (last  3 results) No results for input(s): PROBNP in the last 8760 hours.   ECG REPORT  Independently reviewed Rate:135  Rhythm: A.fib w RVR ST&T Change: No acute ischemic changes  QTC 468  Filed Weights   12/07/17 1811  Weight: 77.1 kg (170 lb)     Cultures: No  results found for: SDES, SPECREQUEST, CULT, REPTSTATUS   Radiological Exams on Admission: Dg Chest 2 View  Result Date: 12/07/2017 CLINICAL DATA:  Cough, flu-like symptoms EXAM: CHEST  2 VIEW COMPARISON:  12/07/2017 FINDINGS: Heart and mediastinal contours are within normal limits. No focal opacities or effusions. No acute bony abnormality. Surgical clips in the left axilla. IMPRESSION: No active cardiopulmonary disease. Electronically Signed   By: Rolm Baptise M.D.   On: 12/07/2017 18:56    Chart has been reviewed    Assessment/Plan  77 y.o. female with medical history significant of a.fib on Eliquis, moderate aortic insufficiency  Admitted for  sepsis likely secondary to CAP  Present on Admission:  . Sepsis ([place]) - Admit per Sepsis protocol likely source being   pneumonia,    rehydrate with 26ml/kg  - initiate  antibiotics for   CAP coverage Azithromycin/Rocephin/vanc  -  obtain blood cultures  - Obtain serial lactic acid  - Obtain procalcitonin level  - Admit and monitor vital signs closely    Sepsis - Repeat Assessment  Performed at:    11:20PM  Vitals     Blood pressure 119/75, pulse (!) 139, temperature (!) 100.7 F (38.2 C), temperature source Oral, resp. rate (!) 24, height 5\' 3"  (1.6 m), weight 77.1 kg (170 lb), SpO2 96 %.  Heart:     Tachycardic  Lungs:    Rhonchi  Capillary Refill:   <2 sec  Peripheral Pulse:   Radial pulse palpable  Skin:     Normal Color   . RLL pneumonia ([place]) - ER imaging personally reviewed-show evidence of persistent infiltrate masked by overlying leads . Atrial fibrillation with RVR ([facility]) -  - Admit to step down on Cardizem drip       CHA2D-VASC score 2      Continue Eliquis      Check TSH      Cycle cardiac enzymes      Obtain ECHO      Cardiology consult in AM if  difficult to control     Suspect RVR response in the setting of sepsis  Hypertension - on Toprol for A. fib versus hypertension. For now on hold restart once  hemodynamically stable. Rate control with diltiazem drip  Other plan as per orders.  DVT prophylaxis:  eliquis   Code Status:  FULL CODE  as per patient    Family Communication:   Family not at  Bedside    Disposition Plan:  To home once workup is complete and patient is stable                              Consults called:  none    Admission status:   inpatient     Level of care        SDU      I have spent a total of 56 min on this admission   Ameirah Khatoon 12/07/2017, 11:37 PM    [facility]  Pager [phone]   after 2 AM please page floor coverage PA If 7AM-7PM, please contact the day team taking care of the patient  [email or web address]  Password [email or web address]

## 2017-12-08 ENCOUNTER — Other Ambulatory Visit (HOSPITAL_COMMUNITY): Payer: Medicare HMO

## 2017-12-08 ENCOUNTER — Encounter (HOSPITAL_COMMUNITY): Payer: Self-pay | Admitting: General Practice

## 2017-12-08 ENCOUNTER — Other Ambulatory Visit: Payer: Self-pay

## 2017-12-08 DIAGNOSIS — J988 Other specified respiratory disorders: Secondary | ICD-10-CM

## 2017-12-08 DIAGNOSIS — B59 Pneumocystosis: Secondary | ICD-10-CM

## 2017-12-08 LAB — RESPIRATORY PANEL BY PCR
Adenovirus: NOT DETECTED
BORDETELLA PERTUSSIS-RVPCR: NOT DETECTED
CORONAVIRUS 229E-RVPPCR: NOT DETECTED
CORONAVIRUS HKU1-RVPPCR: NOT DETECTED
CORONAVIRUS OC43-RVPPCR: NOT DETECTED
Chlamydophila pneumoniae: NOT DETECTED
Coronavirus NL63: NOT DETECTED
INFLUENZA B-RVPPCR: NOT DETECTED
Influenza A: NOT DETECTED
MYCOPLASMA PNEUMONIAE-RVPPCR: NOT DETECTED
Metapneumovirus: NOT DETECTED
PARAINFLUENZA VIRUS 1-RVPPCR: NOT DETECTED
Parainfluenza Virus 2: NOT DETECTED
Parainfluenza Virus 3: NOT DETECTED
Parainfluenza Virus 4: NOT DETECTED
RESPIRATORY SYNCYTIAL VIRUS-RVPPCR: DETECTED — AB
Rhinovirus / Enterovirus: NOT DETECTED

## 2017-12-08 LAB — TSH: TSH: 0.612 u[IU]/mL (ref 0.350–4.500)

## 2017-12-08 LAB — CBC
HEMATOCRIT: 34.6 % — AB (ref 36.0–46.0)
HEMOGLOBIN: 11.4 g/dL — AB (ref 12.0–15.0)
MCH: 30.1 pg (ref 26.0–34.0)
MCHC: 32.9 g/dL (ref 30.0–36.0)
MCV: 91.3 fL (ref 78.0–100.0)
Platelets: 210 10*3/uL (ref 150–400)
RBC: 3.79 MIL/uL — ABNORMAL LOW (ref 3.87–5.11)
RDW: 13.9 % (ref 11.5–15.5)
WBC: 7 10*3/uL (ref 4.0–10.5)

## 2017-12-08 LAB — COMPREHENSIVE METABOLIC PANEL
ALBUMIN: 2.6 g/dL — AB (ref 3.5–5.0)
ALK PHOS: 40 U/L (ref 38–126)
ALT: 26 U/L (ref 14–54)
ANION GAP: 9 (ref 5–15)
AST: 42 U/L — ABNORMAL HIGH (ref 15–41)
BILIRUBIN TOTAL: 0.9 mg/dL (ref 0.3–1.2)
BUN: <5 mg/dL — ABNORMAL LOW (ref 6–20)
CALCIUM: 7.4 mg/dL — AB (ref 8.9–10.3)
CO2: 22 mmol/L (ref 22–32)
Chloride: 107 mmol/L (ref 101–111)
Creatinine, Ser: 0.73 mg/dL (ref 0.44–1.00)
GFR calc Af Amer: >60 mL/min (ref >60)
GLUCOSE: 126 mg/dL — AB (ref 65–99)
POTASSIUM: 4.1 mmol/L (ref 3.5–5.1)
Sodium: 138 mmol/L (ref 135–145)
TOTAL PROTEIN: 5.5 g/dL — AB (ref 6.5–8.1)

## 2017-12-08 LAB — BLOOD GAS, ARTERIAL
Acid-Base Excess: 0.5 mmol/L (ref 0.0–2.0)
Bicarbonate: 23.7 mmol/L (ref 20.0–28.0)
DRAWN BY: 52160
O2 CONTENT: 11 L/min
O2 SAT: 95.1 %
Patient temperature: 101.6
pCO2 arterial: 35.4 mmHg (ref 32.0–48.0)
pH, Arterial: 7.45 (ref 7.350–7.450)
pO2, Arterial: 81 mmHg — ABNORMAL LOW (ref 83.0–108.0)

## 2017-12-08 LAB — LACTIC ACID, PLASMA
Lactic Acid, Venous: 2.2 mmol/L (ref 0.5–1.9)
Lactic Acid, Venous: 1.9 mmol/L (ref 0.5–1.9)

## 2017-12-08 LAB — MAGNESIUM: MAGNESIUM: 1.8 mg/dL (ref 1.7–2.4)

## 2017-12-08 LAB — TROPONIN I

## 2017-12-08 LAB — PHOSPHORUS: PHOSPHORUS: 1.9 mg/dL — AB (ref 2.5–4.6)

## 2017-12-08 LAB — PROCALCITONIN: Procalcitonin: <0.1 ng/mL

## 2017-12-08 MED ORDER — FAMOTIDINE 20 MG PO TABS
20.0000 mg | ORAL_TABLET | ORAL | Status: DC
  Administered 2017-12-09 – 2017-12-12 (×4): 20 mg via ORAL
  Filled 2017-12-08 (×4): qty 1

## 2017-12-08 MED ORDER — SODIUM CHLORIDE 0.9 % IV SOLN
1.0000 g | INTRAVENOUS | Status: DC
  Administered 2017-12-08 – 2017-12-11 (×4): 1 g via INTRAVENOUS
  Filled 2017-12-08 (×4): qty 10

## 2017-12-08 MED ORDER — ONDANSETRON HCL 4 MG/2ML IJ SOLN: 4.0000 mg | Freq: Four times a day (QID) | INTRAMUSCULAR | Status: DC | PRN

## 2017-12-08 MED ORDER — DILTIAZEM HCL-DEXTROSE 100-5 MG/100ML-% IV SOLN (PREMIX)
5.0000 mg/h | INTRAVENOUS | Status: DC
  Administered 2017-12-08: 5 mg/h via INTRAVENOUS
  Administered 2017-12-08 (×2): 7.5 mg/h via INTRAVENOUS
  Filled 2017-12-08 (×5): qty 100

## 2017-12-08 MED ORDER — LEVALBUTEROL HCL 0.63 MG/3ML IN NEBU: 0.6300 mg | INHALATION_SOLUTION | Freq: Four times a day (QID) | RESPIRATORY_TRACT | Status: DC | PRN

## 2017-12-08 MED ORDER — VANCOMYCIN HCL IN DEXTROSE 750-5 MG/150ML-% IV SOLN
750.0000 mg | Freq: Two times a day (BID) | INTRAVENOUS | Status: DC
  Filled 2017-12-08: qty 150

## 2017-12-08 MED ORDER — GUAIFENESIN ER 600 MG PO TB12
600.0000 mg | ORAL_TABLET | Freq: Two times a day (BID) | ORAL | Status: DC
  Administered 2017-12-08 – 2017-12-12 (×10): 600 mg via ORAL
  Filled 2017-12-08 (×10): qty 1

## 2017-12-08 MED ORDER — METOPROLOL SUCCINATE ER 25 MG PO TB24
25.0000 mg | ORAL_TABLET | Freq: Every day | ORAL | Status: DC
  Administered 2017-12-08: 25 mg via ORAL
  Filled 2017-12-08 (×2): qty 1

## 2017-12-08 MED ORDER — SODIUM CHLORIDE 0.9 % IV SOLN
500.0000 mg | INTRAVENOUS | Status: DC
  Administered 2017-12-08: 500 mg via INTRAVENOUS
  Filled 2017-12-08: qty 500

## 2017-12-08 MED ORDER — METHYLPREDNISOLONE SODIUM SUCC 40 MG IJ SOLR
40.0000 mg | Freq: Two times a day (BID) | INTRAMUSCULAR | Status: DC
  Administered 2017-12-08 – 2017-12-12 (×8): 40 mg via INTRAVENOUS
  Filled 2017-12-08 (×8): qty 1

## 2017-12-08 MED ORDER — ACETAMINOPHEN 325 MG PO TABS
650.0000 mg | ORAL_TABLET | Freq: Four times a day (QID) | ORAL | Status: DC | PRN
  Administered 2017-12-08: 650 mg via ORAL
  Filled 2017-12-08: qty 2

## 2017-12-08 MED ORDER — HYDROCODONE-ACETAMINOPHEN 5-325 MG PO TABS: 1.0000 | ORAL_TABLET | ORAL | Status: DC | PRN

## 2017-12-08 MED ORDER — VANCOMYCIN HCL IN DEXTROSE 1-5 GM/200ML-% IV SOLN
1000.0000 mg | Freq: Once | INTRAVENOUS | Status: AC
  Administered 2017-12-08: 1000 mg via INTRAVENOUS
  Filled 2017-12-08: qty 200

## 2017-12-08 MED ORDER — SODIUM CHLORIDE 0.9 % IV SOLN
INTRAVENOUS | Status: AC
  Administered 2017-12-08: 01:00:00 via INTRAVENOUS

## 2017-12-08 MED ORDER — ONDANSETRON HCL 4 MG PO TABS: 4.0000 mg | ORAL_TABLET | Freq: Four times a day (QID) | ORAL | Status: DC | PRN

## 2017-12-08 MED ORDER — ACETAMINOPHEN 650 MG RE SUPP: 650.0000 mg | Freq: Four times a day (QID) | RECTAL | Status: DC | PRN

## 2017-12-08 MED ORDER — SODIUM CHLORIDE 0.9 % IV SOLN
INTRAVENOUS | Status: DC
  Administered 2017-12-08: 14:00:00 via INTRAVENOUS
  Administered 2017-12-09: 1000 mL via INTRAVENOUS
  Administered 2017-12-09: 04:00:00 via INTRAVENOUS

## 2017-12-08 MED ORDER — IPRATROPIUM BROMIDE 0.02 % IN SOLN
0.5000 mg | Freq: Four times a day (QID) | RESPIRATORY_TRACT | Status: DC
  Administered 2017-12-08 (×3): 0.5 mg via RESPIRATORY_TRACT
  Filled 2017-12-08 (×4): qty 2.5

## 2017-12-08 MED ORDER — APIXABAN 5 MG PO TABS
5.0000 mg | ORAL_TABLET | Freq: Two times a day (BID) | ORAL | Status: DC
  Administered 2017-12-08 – 2017-12-12 (×10): 5 mg via ORAL
  Filled 2017-12-08 (×11): qty 1

## 2017-12-08 NOTE — Progress Notes (Addendum)
Pt and husband stated that pt test positive for the flu in the MD office. Pt states she has had diarrhea for a week. Has not been on antibiotics at home. Cont to monitor. Carroll Kinds RN

## 2017-12-08 NOTE — Progress Notes (Signed)
Respiratory assessment done and patient has been changed to PRN for treatments. Flutter valve started to help patient with Rhonchi and movement of secretions. And hopefully help with decreasing O2 needs.Patient has no history indicating treatment being needed scheduled and has had no signs of bronchospasm at this time. Will reevaluate per protocol.

## 2017-12-08 NOTE — Progress Notes (Signed)
[facility] PROGRESS NOTE  Kathryn Chang OIT:[id number] DOB: Apr 07, 1941 DOA: 12/07/2017   PCP: [name], MD     Assessment/Plan: Active Problems:   Long term (current) use of anticoagulants   Atrial fibrillation with RVR ([place])   Sepsis ([place])   RLL pneumonia ([place])   77 y.o. female with medical history significant  For a.fib on Eliquis, moderate aortic insufficiency, who presents with fever and atrial fibrillation with rapid ventricular response. Patient admitted for possible sepsis secondary to bilateral pneumonia and hypoxic respiratory failure. Chest x-ray did not show any active cardiopulmonary disease.   Assessment and plan Sepsis present on admission, likely secondary to viral pneumonia Met sepsis criteria, fever, tachycardia, hypotension, lactic acidosis   Initially started on broad-spectrum antibiotics However Pro calcitonin negative Will discontinue vancomycin but continue with Rocephin and azithromycin as supportive therapy for the next 24 hours Follow blood culture 2  Acute hypoxic respiratory failure Currently requiring 15 L of oxygen Will keep npo pending improvement Likely secondary to RSV, patient was negative for the flu Low-dose Solu-Medrol for supportive therapy   Atrial fibrillation with rapid ventricular response-initially placed on a Cardizem drip CHA2D-VASC score 2,     Continue Eliquis TSH normal 2-D echo pending Resume metoprolol  Hypertension-continue metoprolol    DVT prophylaxsis  eliquis   Code Status:  Dull code        Family Communication: Discussed in detail with the patient, all imaging results, lab results explained to the patient   Disposition Plan:  2 to 3 days      Consultants:  None  Procedures:  None  Antibiotics: Anti-infectives (From admission, onward)   Start     Dose/Rate Route Frequency Ordered Stop   12/08/17 1000  vancomycin (VANCOCIN) IVPB 750 mg/150 ml premix  Status:   Discontinued     750 mg 150 mL/hr over 60 Minutes Intravenous Every 12 hours 12/08/17 0254 12/08/17 1135   12/08/17 0200  vancomycin (VANCOCIN) IVPB 1000 mg/200 mL premix     1,000 mg 200 mL/hr over 60 Minutes Intravenous  Once 12/08/17 0152 12/08/17 0313   12/07/17 2315  cefTRIAXone (ROCEPHIN) 1 g in sodium chloride 0.9 % 100 mL IVPB  Status:  Discontinued     1 g 200 mL/hr over 30 Minutes Intravenous Every 24 hours 12/07/17 2309 12/08/17 1211   12/07/17 2315  azithromycin (ZITHROMAX) 500 mg in sodium chloride 0.9 % 250 mL IVPB  Status:  Discontinued     500 mg 250 mL/hr over 60 Minutes Intravenous Every 24 hours 12/07/17 2309 12/08/17 1211   12/07/17 2230  cefTRIAXone (ROCEPHIN) 1 g in sodium chloride 0.9 % 100 mL IVPB     1 g 200 mL/hr over 30 Minutes Intravenous  Once 12/07/17 2219 12/07/17 2307   12/07/17 2230  azithromycin (ZITHROMAX) tablet 500 mg     500 mg Oral  Once 12/07/17 2219 12/07/17 2237         HPI/Subjective: Now on 12 L of oxygen ,coughing   Objective: Vitals:   12/08/17 1015 12/08/17 1030 12/08/17 1117 12/08/17 1312  BP:  105/67 (!) 120/54   Pulse: (!) 121 (!) 111 (!) 114   Resp: (!) 25 (!) 25 (!) 26   Temp:   (!) 101.6 F (38.7 C)   TempSrc:   Oral   SpO2: 94% 91% 91% 98%  Weight:      Height:        Intake/Output Summary (Last 24 hours)  at 12/08/2017 1338 Last data filed at 12/08/2017 0313 Gross per 24 hour  Intake 2800 ml  Output -  Net 2800 ml    Exam:  Examination:  General exam: Appears calm and comfortable  Respiratory system: slight  basilar wheezing. Respiratory effort mildly increased. Cardiovascular system: S1 & S2 heard, RRR. No JVD, murmurs, rubs, gallops or clicks. No pedal edema. Gastrointestinal system: Abdomen is nondistended, soft and nontender. No organomegaly or masses felt. Normal bowel sounds heard. Central nervous system: Alert and oriented. No focal neurological deficits. Extremities: Symmetric 5 x 5 power. Skin: No  rashes, lesions or ulcers Psychiatry: Judgement and insight appear normal. Mood & affect appropriate.     Data Reviewed: I have personally reviewed following labs and imaging studies  Micro Results Recent Results (from the past 240 hour(s))  Respiratory Panel by PCR     Status: Abnormal   Collection Time: 12/08/17 12:53 AM  Result Value Ref Range Status   Adenovirus NOT DETECTED NOT DETECTED Final   Coronavirus 229E NOT DETECTED NOT DETECTED Final   Coronavirus HKU1 NOT DETECTED NOT DETECTED Final   Coronavirus NL63 NOT DETECTED NOT DETECTED Final   Coronavirus OC43 NOT DETECTED NOT DETECTED Final   Metapneumovirus NOT DETECTED NOT DETECTED Final   Rhinovirus / Enterovirus NOT DETECTED NOT DETECTED Final   Influenza A NOT DETECTED NOT DETECTED Final   Influenza B NOT DETECTED NOT DETECTED Final   Parainfluenza Virus 1 NOT DETECTED NOT DETECTED Final   Parainfluenza Virus 2 NOT DETECTED NOT DETECTED Final   Parainfluenza Virus 3 NOT DETECTED NOT DETECTED Final   Parainfluenza Virus 4 NOT DETECTED NOT DETECTED Final   Respiratory Syncytial Virus DETECTED (A) NOT DETECTED Final    Comment: CRITICAL RESULT CALLED TO, READ BACK BY AND VERIFIED WITH: Gareth Eagle RN 12/08/17 0521 JDW    Bordetella pertussis NOT DETECTED NOT DETECTED Final   Chlamydophila pneumoniae NOT DETECTED NOT DETECTED Final   Mycoplasma pneumoniae NOT DETECTED NOT DETECTED Final    Comment: Performed at [facility], [address] [place], [place] [address]  Culture, sputum-assessment     Status: None (Preliminary result)   Collection Time: 12/08/17  2:11 AM  Result Value Ref Range Status   Specimen Description EXPECTORATED SPUTUM  Final   Special Requests Normal  Final   Sputum evaluation   Final    THIS SPECIMEN IS ACCEPTABLE FOR SPUTUM CULTURE Performed at [facility], [place] [address], [place], [place] [address]    Report Status PENDING  Incomplete  Culture, respiratory (NON-Expectorated)      Status: None (Preliminary result)   Collection Time: 12/08/17  2:11 AM  Result Value Ref Range Status   Specimen Description EXPECTORATED SPUTUM  Final   Special Requests Normal Reflexed from [id number]  Final   Gram Stain   Final    RARE WBC PRESENT,BOTH PMN AND MONONUCLEAR FEW GRAM POSITIVE COCCI FEW GRAM NEGATIVE RODS RARE GRAM POSITIVE RODS Performed at [facility], [place] [address], [place], [place] [address]    Culture PENDING  Incomplete   Report Status PENDING  Incomplete    Radiology Reports Ct Abdomen Pelvis Wo Contrast  Result Date: 11/17/2017 CLINICAL DATA:  Hematuria. Prior appendectomy. History of left breast cancer. EXAM: CT ABDOMEN AND PELVIS WITHOUT CONTRAST TECHNIQUE: Multidetector CT imaging of the abdomen and pelvis was performed following the standard protocol without IV contrast. COMPARISON:  None. FINDINGS: Lower chest: Unremarkable. Hepatobiliary: 19 mm well-defined low-density lesion  medial right liver approaches water attenuation and is likely a cyst. Gallbladder unremarkable. No intrahepatic or extrahepatic biliary dilation. Pancreas: No focal mass lesion. No dilatation of the main duct. No intraparenchymal cyst. No peripancreatic edema. Spleen: No splenomegaly. No focal mass lesion. Adrenals/Urinary Tract: No adrenal nodule or mass. 2 x 4 mm nonobstructing stone identified interpolar right kidney. 9 x 6 x 7 mm nonobstructing stone is present in the right renal pelvis. No gross mass lesion in the right kidney on this noncontrast study. No evidence for right ureteral stone. No left renal stone disease. No gross left renal mass. No secondary changes in the left kidney or ureter. No left ureteral stone. Bladder is decompressed without stone disease evident. Stomach/Bowel: Tiny hiatal hernia. Stomach otherwise unremarkable. Duodenum is normally positioned as is the ligament of Treitz. No small bowel wall thickening. No small bowel dilatation. The terminal ileum is normal.  Nonvisualization of the appendix is consistent with the reported history of appendectomy. Diverticular changes are noted in the left colon without evidence of diverticulitis. Vascular/Lymphatic: No abdominal aortic aneurysm. There is no gastrohepatic or hepatoduodenal ligament lymphadenopathy. No intraperitoneal or retroperitoneal lymphadenopathy. No pelvic sidewall lymphadenopathy. Reproductive: The uterus has normal CT imaging appearance. There is no adnexal mass. Other: No intraperitoneal free fluid. Musculoskeletal: Bone windows reveal no worrisome lytic or sclerotic osseous lesions. Superior endplate compression fracture noted at L1. IMPRESSION: 1. Nonobstructing stones identified in the interpolar right kidney and right renal pelvis. No other urinary stone disease evident. No secondary changes in either kidney or ureter. No evidence of bladder stones. 2. Right hepatic cyst. 3. L1 superior endplate compression fracture.  The Electronically Signed   By: Misty Stanley M.D.   On: 11/17/2017 12:21   Dg Chest 2 View  Result Date: 12/07/2017 CLINICAL DATA:  Cough, flu-like symptoms EXAM: CHEST  2 VIEW COMPARISON:  12/07/2017 FINDINGS: Heart and mediastinal contours are within normal limits. No focal opacities or effusions. No acute bony abnormality. Surgical clips in the left axilla. IMPRESSION: No active cardiopulmonary disease. Electronically Signed   By: Rolm Baptise M.D.   On: 12/07/2017 18:56     CBC Recent Labs  Lab 12/07/17 1808 12/08/17 0531  WBC 9.2 7.0  HGB 14.7 11.4*  HCT 43.3 34.6*  PLT 265 210  MCV 92.1 91.3  MCH 31.3 30.1  MCHC 33.9 32.9  RDW 13.9 13.9    Chemistries  Recent Labs  Lab 12/07/17 1808 12/08/17 0531  NA 135 138  K 3.7 4.1  CL 101 107  CO2 21* 22  GLUCOSE 128* 126*  BUN 9 <5*  CREATININE 0.92 0.73  CALCIUM 8.8* 7.4*  MG  --  1.8  AST  --  42*  ALT  --  26  ALKPHOS  --  40  BILITOT  --  0.9    ------------------------------------------------------------------------------------------------------------------ estimated creatinine clearance is 60.7 mL/min (by C-G formula based on SCr of 0.73 mg/dL). ------------------------------------------------------------------------------------------------------------------ No results for input(s): HGBA1C in the last 72 hours. ------------------------------------------------------------------------------------------------------------------ No results for input(s): CHOL, HDL, LDLCALC, TRIG, CHOLHDL, LDLDIRECT in the last 72 hours. ------------------------------------------------------------------------------------------------------------------ Recent Labs    12/08/17 0531  TSH 0.612   ------------------------------------------------------------------------------------------------------------------ No results for input(s): VITAMINB12, FOLATE, FERRITIN, TIBC, IRON, RETICCTPCT in the last 72 hours.  Coagulation profile Recent Labs  Lab 12/07/17 1808  INR 1.20    No results for input(s): DDIMER in the last 72 hours.  Cardiac Enzymes Recent Labs  Lab 12/08/17 0041 12/08/17 0531  TROPONINI <0.03 <0.03   ------------------------------------------------------------------------------------------------------------------  Invalid input(s): POCBNP   CBG: No results for input(s): GLUCAP in the last 168 hours.     Studies: Dg Chest 2 View  Result Date: 12/07/2017 CLINICAL DATA:  Cough, flu-like symptoms EXAM: CHEST  2 VIEW COMPARISON:  12/07/2017 FINDINGS: Heart and mediastinal contours are within normal limits. No focal opacities or effusions. No acute bony abnormality. Surgical clips in the left axilla. IMPRESSION: No active cardiopulmonary disease. Electronically Signed   By: Rolm Baptise M.D.   On: 12/07/2017 18:56      No results found for: HGBA1C Lab Results  Component Value Date   CREATININE 0.73 12/08/2017        Scheduled Meds: . apixaban  5 mg Oral BID  . guaiFENesin  600 mg Oral BID  . ipratropium  0.5 mg Nebulization Q6H   Continuous Infusions: . sodium chloride    . diltiazem (CARDIZEM) infusion 7.5 mg/hr (12/08/17 0815)     LOS: 1 day    Time spent: >30 MINS    Reyne Dumas  [facility] Pager [phone]. If 7PM-7AM, please contact night-coverage at [email or web address], password [facility] 12/08/2017, 1:38 PM  LOS: 1 day

## 2017-12-08 NOTE — Progress Notes (Signed)
Pharmacy Antibiotic Note  FANCHON PAPANIA is a 77 y.o. female admitted on 12/07/2017 with pneumonia.  Pharmacy has been consulted for Vancomycin dosing. WBC WNL. Renal function ok. Lactic acid elevated.   Plan: Vancomycin 750 mg IV q12h Ceftriaxone/Azithromycin per MD Trend WBC, temp, renal function  F/U infectious work-up Drug levels as indicated   Height: 5\' 3"  (160 cm) Weight: 170 lb (77.1 kg) IBW/kg (Calculated) : 52.4  Temp (24hrs), Avg:100.7 F (38.2 C), Min:100.7 F (38.2 C), Max:100.7 F (38.2 C)  Recent Labs  Lab 12/07/17 1808 12/07/17 1851 12/07/17 2031 12/08/17 0045  WBC 9.2  --   --   --   CREATININE 0.92  --   --   --   LATICACIDVEN  --  2.46* 2.37* 2.2*    Estimated Creatinine Clearance: 52.8 mL/min (by C-G formula based on SCr of 0.92 mg/dL).    Allergies  Allergen Reactions  . Shellfish-Derived Products Anaphylaxis    Pass out and incontinent   . Tamiflu [Oseltamivir Phosphate]     Total body rash    Narda Bonds 12/08/2017 2:49 AM

## 2017-12-08 NOTE — ED Notes (Signed)
Pt ambulated with steady gait in room getting to bedside commode and O2 sats dropped to 86%. Pt placed back on 3L Kathryn Chang

## 2017-12-08 NOTE — Discharge Instructions (Addendum)

## 2017-12-09 ENCOUNTER — Inpatient Hospital Stay (HOSPITAL_COMMUNITY): Payer: Medicare HMO

## 2017-12-09 DIAGNOSIS — I340 Nonrheumatic mitral (valve) insufficiency: Secondary | ICD-10-CM

## 2017-12-09 DIAGNOSIS — I351 Nonrheumatic aortic (valve) insufficiency: Secondary | ICD-10-CM

## 2017-12-09 LAB — ECHOCARDIOGRAM COMPLETE
HEIGHTINCHES: 63 in
WEIGHTICAEL: 2860.8 [oz_av]

## 2017-12-09 LAB — MRSA PCR SCREENING: MRSA by PCR: NEGATIVE

## 2017-12-09 MED ORDER — MAGNESIUM OXIDE 400 (241.3 MG) MG PO TABS
400.0000 mg | ORAL_TABLET | Freq: Every day | ORAL | Status: DC
  Administered 2017-12-09 – 2017-12-12 (×4): 400 mg via ORAL
  Filled 2017-12-09 (×4): qty 1

## 2017-12-09 MED ORDER — ORAL CARE MOUTH RINSE
15.0000 mL | Freq: Two times a day (BID) | OROMUCOSAL | Status: DC
  Administered 2017-12-09 – 2017-12-11 (×4): 15 mL via OROMUCOSAL

## 2017-12-09 MED ORDER — CHLORHEXIDINE GLUCONATE 0.12 % MT SOLN
15.0000 mL | Freq: Two times a day (BID) | OROMUCOSAL | Status: DC
  Administered 2017-12-09 – 2017-12-12 (×6): 15 mL via OROMUCOSAL
  Filled 2017-12-09 (×6): qty 15

## 2017-12-09 MED ORDER — AZITHROMYCIN 250 MG PO TABS
250.0000 mg | ORAL_TABLET | Freq: Every day | ORAL | Status: DC
  Administered 2017-12-09 – 2017-12-10 (×2): 250 mg via ORAL
  Filled 2017-12-09 (×2): qty 1

## 2017-12-09 MED ORDER — METOPROLOL SUCCINATE ER 50 MG PO TB24
50.0000 mg | ORAL_TABLET | Freq: Every day | ORAL | Status: DC
  Administered 2017-12-09 – 2017-12-12 (×4): 50 mg via ORAL
  Filled 2017-12-09 (×4): qty 1

## 2017-12-09 NOTE — Evaluation (Signed)
Physical Therapy Evaluation Patient Details Name: Kathryn Chang MRN: [id number] DOB: Aug 20, 1941 Today's Date: 12/09/2017   History of Present Illness  Pt is a 77 y/o female admitted secondary to a 4-5 day hx of cough with intermittent fevers. Pt found to have the flu. PMH including but not limited to a-fib.    Clinical Impression  Pt presented supine in bed with HOB elevated, awake and willing to participate in therapy session. Pt's husband present at beginning of session. Prior to admission, pt reported that she was independent with all functional mobility and ADLs. Pt lives in a two level house with her husband, but they are able to live on the main floor of the home. Pt currently limited with mobility secondary to fatigue. Pt on 8.5L of supplemental O2 via HFNC at beginning of session with SPO2 fluctuating between 85-88% at rest. Pt on RA with activity with SPO2 increasing to as high as 94%. Upon sitting in recliner chair at end of session, pt's SPO2 on RA maintaining at 87%. PT reapplied supplemental O2 and notified pt's RN. PT will continue to follow acutely to progress mobility as tolerated and to ensure a safe d/c home.    Follow Up Recommendations Supervision for mobility/OOB    Equipment Recommendations  None recommended by PT    Recommendations for Other Services       Precautions / Restrictions Precautions Precautions: Fall Restrictions Weight Bearing Restrictions: No      Mobility  Bed Mobility Overal bed mobility: Needs Assistance Bed Mobility: Supine to Sit     Supine to sit: Supervision     General bed mobility comments: for safety  Transfers Overall transfer level: Needs assistance Equipment used: None Transfers: Sit to/from Stand;Stand Pivot Transfers Sit to Stand: Supervision Stand pivot transfers: Supervision       General transfer comment: supervision for safety, no instability with movements  Ambulation/Gait Ambulation/Gait assistance:  Min guard Ambulation Distance (Feet): 25 Feet Assistive device: None Gait Pattern/deviations: Step-through pattern;Decreased step length - right;Decreased step length - left;Decreased stride length Gait velocity: decreased Gait velocity interpretation: Below normal speed for age/gender General Gait Details: no instability or LOB without use of RW, min guard for safety; pt limited secondary to fatigue  Stairs            Wheelchair Mobility    Modified Rankin (Stroke Patients Only)       Balance Overall balance assessment: Needs assistance Sitting-balance support: Feet supported Sitting balance-Leahy Scale: Good     Standing balance support: During functional activity;No upper extremity supported Standing balance-Leahy Scale: Fair                               Pertinent Vitals/Pain Pain Assessment: No/denies pain    Home Living Family/patient expects to be discharged to:: Private residence Living Arrangements: Spouse/significant other Available Help at Discharge: Family;Available PRN/intermittently Type of Home: House Home Access: Stairs to enter Entrance Stairs-Rails: Psychiatric nurse of Steps: 3 Home Layout: Able to live on main level with bedroom/bathroom Home Equipment: Walker - 2 wheels;Cane - single point      Prior Function Level of Independence: Independent               Hand Dominance        Extremity/Trunk Assessment   Upper Extremity Assessment Upper Extremity Assessment: Overall WFL for tasks assessed    Lower Extremity Assessment Lower Extremity Assessment: Overall WFL for tasks  assessed       Communication   Communication: No difficulties  Cognition Arousal/Alertness: Awake/alert Behavior During Therapy: WFL for tasks assessed/performed Overall Cognitive Status: Within Functional Limits for tasks assessed                                        General Comments      Exercises      Assessment/Plan    PT Assessment Patient needs continued PT services  PT Problem List Decreased activity tolerance;Decreased balance;Decreased mobility;Decreased coordination;Cardiopulmonary status limiting activity       PT Treatment Interventions DME instruction;Gait training;Stair training;Functional mobility training;Therapeutic activities;Therapeutic exercise;Neuromuscular re-education;Balance training;Patient/family education    PT Goals (Current goals can be found in the Care Plan section)  Acute Rehab PT Goals Patient Stated Goal: return home soon PT Goal Formulation: With patient Time For Goal Achievement: 12/23/17 Potential to Achieve Goals: Good    Frequency Min 3X/week   Barriers to discharge        Co-evaluation               AM-PAC PT "6 Clicks" Daily Activity  Outcome Measure Difficulty turning over in bed (including adjusting bedclothes, sheets and blankets)?: None Difficulty moving from lying on back to sitting on the side of the bed? : None Difficulty sitting down on and standing up from a chair with arms (e.g., wheelchair, bedside commode, etc,.)?: A Little Help needed moving to and from a bed to chair (including a wheelchair)?: A Little Help needed walking in hospital room?: A Little Help needed climbing 3-5 steps with a railing? : A Little 6 Click Score: 20    End of Session Equipment Utilized During Treatment: Oxygen Activity Tolerance: Patient limited by fatigue Patient left: in chair;with call bell/phone within reach Nurse Communication: Mobility status;Other (comment)(SPO2 levels with and without supplemental O2) PT Visit Diagnosis: Other abnormalities of gait and mobility (R26.89)    Time: 1040-1101 PT Time Calculation (min) (ACUTE ONLY): 21 min   Charges:   PT Evaluation $PT Eval Moderate Complexity: 1 Mod     PT G Codes:        Churchill, PT, DPT [place] 12/09/2017, 12:03 PM

## 2017-12-09 NOTE — Progress Notes (Signed)
  Echocardiogram 2D Echocardiogram has been performed.  Bobbye Charleston 12/09/2017, 12:04 PM

## 2017-12-09 NOTE — Progress Notes (Signed)
[facility] PROGRESS NOTE  GOLDYE TOURANGEAU VXY:[id number] DOB: 1941/05/17 DOA: 12/07/2017   PCP: [name], MD     Assessment/Plan: Active Problems:   Long term (current) use of anticoagulants   Atrial fibrillation with RVR (HCC)   Sepsis ([place])   RLL pneumonia ([place])   Respiratory infection   77 y.o. female with medical history significant  For a.fib on Eliquis, moderate aortic insufficiency, who presents with fever and atrial fibrillation with rapid ventricular response in the setting of sepsis. Patient admitted for possible sepsis secondary to acute viral bronchitis/probable early pneumonia and hypoxic respiratory failure. Chest x-ray did not show any active cardiopulmonary disease.   Assessment and plan Sepsis present on admission, likely secondary to viral pneumonia Met sepsis criteria, fever, tachycardia, hypotension, lactic acidosis   Initially started on broad-spectrum antibiotics However Pro calcitonin negative Will discontinue vancomycin but continue with Rocephin and azithromycin as supportive therapy   Blood culture no growth so far  Acute hypoxic respiratory failure Currently requiring 15 L>12>10L  of oxygen this morning Likely secondary to RSV, patient was negative for the flu, but positive for influenza A prior to admission per patient Low-dose Solu-Medrol for supportive therapy   Atrial fibrillation with rapid ventricular response-initially placed on a Cardizem drip, instructed RN to taper it off  For heart rate less than 100 CHA2D-VASC score 2,     Continue Eliquis TSH normal 2-D echo The estimated ejection  fraction was in the range of 50% to 55%. Diffuse hypokinesis Continue metoprolol, increase dose  Hypertension-continue metoprolol    DVT prophylaxsis  eliquis   Code Status:  Dull code        Family Communication: Discussed in detail with the patient, all imaging results, lab results explained to the patient   Disposition  Plan:   Continues to have a high oxygen requirements     Consultants:  None  Procedures:  None  Antibiotics: Anti-infectives (From admission, onward)   Start     Dose/Rate Route Frequency Ordered Stop   12/08/17 2200  cefTRIAXone (ROCEPHIN) 1 g in sodium chloride 0.9 % 100 mL IVPB     1 g 200 mL/hr over 30 Minutes Intravenous Every 24 hours 12/08/17 1344     12/08/17 2200  azithromycin (ZITHROMAX) 500 mg in sodium chloride 0.9 % 250 mL IVPB     500 mg 250 mL/hr over 60 Minutes Intravenous Every 24 hours 12/08/17 1344     12/08/17 1000  vancomycin (VANCOCIN) IVPB 750 mg/150 ml premix  Status:  Discontinued     750 mg 150 mL/hr over 60 Minutes Intravenous Every 12 hours 12/08/17 0254 12/08/17 1135   12/08/17 0200  vancomycin (VANCOCIN) IVPB 1000 mg/200 mL premix     1,000 mg 200 mL/hr over 60 Minutes Intravenous  Once 12/08/17 0152 12/08/17 0313   12/07/17 2315  cefTRIAXone (ROCEPHIN) 1 g in sodium chloride 0.9 % 100 mL IVPB  Status:  Discontinued     1 g 200 mL/hr over 30 Minutes Intravenous Every 24 hours 12/07/17 2309 12/08/17 1211   12/07/17 2315  azithromycin (ZITHROMAX) 500 mg in sodium chloride 0.9 % 250 mL IVPB  Status:  Discontinued     500 mg 250 mL/hr over 60 Minutes Intravenous Every 24 hours 12/07/17 2309 12/08/17 1211   12/07/17 2230  cefTRIAXone (ROCEPHIN) 1 g in sodium chloride 0.9 % 100 mL IVPB     1 g 200 mL/hr over 30 Minutes Intravenous  Once 12/07/17  2219 12/07/17 2307   12/07/17 2230  azithromycin (ZITHROMAX) tablet 500 mg     500 mg Oral  Once 12/07/17 2219 12/07/17 2237         HPI/Subjective: Now on 10 L of oxygen ,coughing   Objective: Vitals:   12/08/17 2030 12/09/17 0012 12/09/17 0500 12/09/17 0758  BP:  (!) 105/54 128/76 114/61  Pulse: 79 71 84 87  Resp: (!) 24 (!) 21 16 (!) 23  Temp:  98.5 F (36.9 C) (!) 97.4 F (36.3 C) 98.1 F (36.7 C)  TempSrc:  Oral Oral Oral  SpO2: 94% 91% 92% 90%  Weight:   81.1 kg (178 lb 12.8 oz)    Height:        Intake/Output Summary (Last 24 hours) at 12/09/2017 0852 Last data filed at 12/09/2017 7416 Gross per 24 hour  Intake 2003.75 ml  Output 1150 ml  Net 853.75 ml    Exam:  Examination:  General exam: Appears calm and comfortable  Respiratory system: slight  basilar wheezing. Respiratory effort mildly increased. Cardiovascular system: S1 & S2 heard, RRR. No JVD, murmurs, rubs, gallops or clicks. No pedal edema. Gastrointestinal system: Abdomen is nondistended, soft and nontender. No organomegaly or masses felt. Normal bowel sounds heard. Central nervous system: Alert and oriented. No focal neurological deficits. Extremities: Symmetric 5 x 5 power. Skin: No rashes, lesions or ulcers Psychiatry: Judgement and insight appear normal. Mood & affect appropriate.     Data Reviewed: I have personally reviewed following labs and imaging studies  Micro Results Recent Results (from the past 240 hour(s))  Culture, blood (routine x 2)     Status: None (Preliminary result)   Collection Time: 12/07/17  6:05 PM  Result Value Ref Range Status   Specimen Description BLOOD LEFT ANTECUBITAL  Final   Special Requests   Final    BOTTLES DRAWN AEROBIC AND ANAEROBIC Blood Culture adequate volume   Culture   Final    NO GROWTH < 24 HOURS Performed at [facility], [address], [place], [place] [address]    Report Status PENDING  Incomplete  Culture, blood (routine x 2)     Status: None (Preliminary result)   Collection Time: 12/07/17  7:03 PM  Result Value Ref Range Status   Specimen Description BLOOD RIGHT ANTECUBITAL  Final   Special Requests   Final    BOTTLES DRAWN AEROBIC AND ANAEROBIC Blood Culture adequate volume   Culture   Final    NO GROWTH < 24 HOURS Performed at [facility], [place] [address] [place], [place] [address]    Report Status PENDING  Incomplete  Respiratory Panel by PCR     Status: Abnormal   Collection Time: 12/08/17 12:53 AM  Result  Value Ref Range Status   Adenovirus NOT DETECTED NOT DETECTED Final   Coronavirus 229E NOT DETECTED NOT DETECTED Final   Coronavirus HKU1 NOT DETECTED NOT DETECTED Final   Coronavirus NL63 NOT DETECTED NOT DETECTED Final   Coronavirus OC43 NOT DETECTED NOT DETECTED Final   Metapneumovirus NOT DETECTED NOT DETECTED Final   Rhinovirus / Enterovirus NOT DETECTED NOT DETECTED Final   Influenza A NOT DETECTED NOT DETECTED Final   Influenza B NOT DETECTED NOT DETECTED Final   Parainfluenza Virus 1 NOT DETECTED NOT DETECTED Final   Parainfluenza Virus 2 NOT DETECTED NOT DETECTED Final   Parainfluenza Virus 3 NOT DETECTED NOT DETECTED Final   Parainfluenza Virus 4 NOT DETECTED NOT DETECTED Final  Respiratory Syncytial Virus DETECTED (A) NOT DETECTED Final    Comment: CRITICAL RESULT CALLED TO, READ BACK BY AND VERIFIED WITH: Gareth Eagle RN 12/08/17 0521 JDW    Bordetella pertussis NOT DETECTED NOT DETECTED Final   Chlamydophila pneumoniae NOT DETECTED NOT DETECTED Final   Mycoplasma pneumoniae NOT DETECTED NOT DETECTED Final    Comment: Performed at [facility], [place] [address] [place], [place] [address]  Culture, sputum-assessment     Status: None (Preliminary result)   Collection Time: 12/08/17  2:11 AM  Result Value Ref Range Status   Specimen Description EXPECTORATED SPUTUM  Final   Special Requests Normal  Final   Sputum evaluation   Final    THIS SPECIMEN IS ACCEPTABLE FOR SPUTUM CULTURE Performed at [facility], [place] [address] [place], [place] [address]    Report Status PENDING  Incomplete  Culture, respiratory (NON-Expectorated)     Status: None (Preliminary result)   Collection Time: 12/08/17  2:11 AM  Result Value Ref Range Status   Specimen Description EXPECTORATED SPUTUM  Final   Special Requests Normal Reflexed from [id number]  Final   Gram Stain   Final    RARE WBC PRESENT,BOTH PMN AND MONONUCLEAR FEW GRAM POSITIVE COCCI FEW GRAM NEGATIVE RODS RARE GRAM  POSITIVE RODS Performed at [facility], [place] [address] [place], [place] [address]    Culture PENDING  Incomplete   Report Status PENDING  Incomplete  MRSA PCR Screening     Status: None   Collection Time: 12/09/17  3:31 AM  Result Value Ref Range Status   MRSA by PCR NEGATIVE NEGATIVE Final    Comment:        The GeneXpert MRSA Assay (FDA approved for NASAL specimens only), is one component of a comprehensive MRSA colonization surveillance program. It is not intended to diagnose MRSA infection nor to guide or monitor treatment for MRSA infections. Performed at [facility], [place] [address], [place], [place] [address]     Radiology Reports Ct Abdomen Pelvis Wo Contrast  Result Date: 11/17/2017 CLINICAL DATA:  Hematuria. Prior appendectomy. History of left breast cancer. EXAM: CT ABDOMEN AND PELVIS WITHOUT CONTRAST TECHNIQUE: Multidetector CT imaging of the abdomen and pelvis was performed following the standard protocol without IV contrast. COMPARISON:  None. FINDINGS: Lower chest: Unremarkable. Hepatobiliary: 19 mm well-defined low-density lesion medial right liver approaches water attenuation and is likely a cyst. Gallbladder unremarkable. No intrahepatic or extrahepatic biliary dilation. Pancreas: No focal mass lesion. No dilatation of the main duct. No intraparenchymal cyst. No peripancreatic edema. Spleen: No splenomegaly. No focal mass lesion. Adrenals/Urinary Tract: No adrenal nodule or mass. 2 x 4 mm nonobstructing stone identified interpolar right kidney. 9 x 6 x 7 mm nonobstructing stone is present in the right renal pelvis. No gross mass lesion in the right kidney on this noncontrast study. No evidence for right ureteral stone. No left renal stone disease. No gross left renal mass. No secondary changes in the left kidney or ureter. No left ureteral stone. Bladder is decompressed without stone disease evident. Stomach/Bowel: Tiny hiatal hernia. Stomach otherwise  unremarkable. Duodenum is normally positioned as is the ligament of Treitz. No small bowel wall thickening. No small bowel dilatation. The terminal ileum is normal. Nonvisualization of the appendix is consistent with the reported history of appendectomy. Diverticular changes are noted in the left colon without evidence of diverticulitis. Vascular/Lymphatic: No abdominal aortic aneurysm. There is no gastrohepatic or hepatoduodenal ligament lymphadenopathy.  No intraperitoneal or retroperitoneal lymphadenopathy. No pelvic sidewall lymphadenopathy. Reproductive: The uterus has normal CT imaging appearance. There is no adnexal mass. Other: No intraperitoneal free fluid. Musculoskeletal: Bone windows reveal no worrisome lytic or sclerotic osseous lesions. Superior endplate compression fracture noted at L1. IMPRESSION: 1. Nonobstructing stones identified in the interpolar right kidney and right renal pelvis. No other urinary stone disease evident. No secondary changes in either kidney or ureter. No evidence of bladder stones. 2. Right hepatic cyst. 3. L1 superior endplate compression fracture.  The Electronically Signed   By: Misty Stanley M.D.   On: 11/17/2017 12:21   Dg Chest 2 View  Result Date: 12/07/2017 CLINICAL DATA:  Cough, flu-like symptoms EXAM: CHEST  2 VIEW COMPARISON:  12/07/2017 FINDINGS: Heart and mediastinal contours are within normal limits. No focal opacities or effusions. No acute bony abnormality. Surgical clips in the left axilla. IMPRESSION: No active cardiopulmonary disease. Electronically Signed   By: Rolm Baptise M.D.   On: 12/07/2017 18:56   Dg Chest Port 1 View  Result Date: 12/09/2017 CLINICAL DATA:  Cough and shortness of breath EXAM: PORTABLE CHEST 1 VIEW COMPARISON:  December 07, 2017 FINDINGS: There is mild left base atelectasis. Lungs elsewhere are clear. Heart is borderline enlarged with pulmonary vascularity within normal limits. No adenopathy. There are surgical clips in left  axillary region. No pneumothorax. No bone lesions. IMPRESSION: Mild left base atelectasis. Lungs elsewhere clear. Stable cardiac silhouette. Electronically Signed   By: Lowella Grip III M.D.   On: 12/09/2017 07:51     CBC Recent Labs  Lab 12/07/17 1808 12/08/17 0531  WBC 9.2 7.0  HGB 14.7 11.4*  HCT 43.3 34.6*  PLT 265 210  MCV 92.1 91.3  MCH 31.3 30.1  MCHC 33.9 32.9  RDW 13.9 13.9    Chemistries  Recent Labs  Lab 12/07/17 1808 12/08/17 0531  NA 135 138  K 3.7 4.1  CL 101 107  CO2 21* 22  GLUCOSE 128* 126*  BUN 9 <5*  CREATININE 0.92 0.73  CALCIUM 8.8* 7.4*  MG  --  1.8  AST  --  42*  ALT  --  26  ALKPHOS  --  40  BILITOT  --  0.9   ------------------------------------------------------------------------------------------------------------------ estimated creatinine clearance is 62.2 mL/min (by C-G formula based on SCr of 0.73 mg/dL). ------------------------------------------------------------------------------------------------------------------ No results for input(s): HGBA1C in the last 72 hours. ------------------------------------------------------------------------------------------------------------------ No results for input(s): CHOL, HDL, LDLCALC, TRIG, CHOLHDL, LDLDIRECT in the last 72 hours. ------------------------------------------------------------------------------------------------------------------ Recent Labs    12/08/17 0531  TSH 0.612   ------------------------------------------------------------------------------------------------------------------ No results for input(s): VITAMINB12, FOLATE, FERRITIN, TIBC, IRON, RETICCTPCT in the last 72 hours.  Coagulation profile Recent Labs  Lab 12/07/17 1808  INR 1.20    No results for input(s): DDIMER in the last 72 hours.  Cardiac Enzymes Recent Labs  Lab 12/08/17 0041 12/08/17 0531 12/08/17 1247  TROPONINI <0.03 <0.03 <0.03    ------------------------------------------------------------------------------------------------------------------ Invalid input(s): POCBNP   CBG: No results for input(s): GLUCAP in the last 168 hours.     Studies: Dg Chest 2 View  Result Date: 12/07/2017 CLINICAL DATA:  Cough, flu-like symptoms EXAM: CHEST  2 VIEW COMPARISON:  12/07/2017 FINDINGS: Heart and mediastinal contours are within normal limits. No focal opacities or effusions. No acute bony abnormality. Surgical clips in the left axilla. IMPRESSION: No active cardiopulmonary disease. Electronically Signed   By: Rolm Baptise M.D.   On: 12/07/2017 18:56   Dg Chest Port 1 View  Result Date:  12/09/2017 CLINICAL DATA:  Cough and shortness of breath EXAM: PORTABLE CHEST 1 VIEW COMPARISON:  December 07, 2017 FINDINGS: There is mild left base atelectasis. Lungs elsewhere are clear. Heart is borderline enlarged with pulmonary vascularity within normal limits. No adenopathy. There are surgical clips in left axillary region. No pneumothorax. No bone lesions. IMPRESSION: Mild left base atelectasis. Lungs elsewhere clear. Stable cardiac silhouette. Electronically Signed   By: Lowella Grip III M.D.   On: 12/09/2017 07:51      No results found for: HGBA1C Lab Results  Component Value Date   CREATININE 0.73 12/08/2017       Scheduled Meds: . apixaban  5 mg Oral BID  . chlorhexidine  15 mL Mouth Rinse BID  . famotidine  20 mg Oral BH-q7a  . guaiFENesin  600 mg Oral BID  . mouth rinse  15 mL Mouth Rinse q12n4p  . methylPREDNISolone (SOLU-MEDROL) injection  40 mg Intravenous Q12H  . metoprolol succinate  25 mg Oral Daily   Continuous Infusions: . sodium chloride 75 mL/hr at 12/09/17 0335  . azithromycin Stopped (12/09/17 0020)  . cefTRIAXone (ROCEPHIN)  IV Stopped (12/08/17 2230)  . diltiazem (CARDIZEM) infusion 7.5 mg/hr (12/08/17 2344)     LOS: 2 days    Time spent: >30 MINS    Reyne Dumas  [facility] Pager [phone]. If 7PM-7AM, please contact night-coverage at [email or web address], password [email or web address] 12/09/2017, 8:52 AM  LOS: 2 days

## 2017-12-09 NOTE — Consult Note (Signed)
[facility] CM Primary Care Navigator  12/09/2017  Kathryn Chang 12-Sep-1941 [id number]   Met withpatient at the bedsideto identify possible discharge needs. Patient reports seeing primary care provider and was directed to the hospital for having "worsening cough, weakness, fever and elevated heart rate at 150s" which resulted to this admission.  Patientendorses Dr.Kevin Little with [place] [facility] as herprimary care provider.   Patient verbalized using [facility] Mail Order Delivery serviceto obtain medications without difficulty.   Patient reportsman[facility]her own medications at [facility] use of "pill box" system filled every 2 weeks.  Patient statesthatshe was driving prior to admission but husband Antony Haste) will providetransportation to herdoctors'appointments after discharge.  Reminded of [employer] transportation benefits as well.  Patientlives with husband at home and hewill serve as theprimary caregiverfor her.   Anticipated discharge plan is homewhen ready per patient.  Patient voiced understanding to call primary care provider's officewhen shereturnshome,for a post discharge follow-up within1-2 weeksor sooner if needs arise.Patient letter (with PCP's contact number) was provided asareminder.  Discussed with patient regarding [facility] CM services available for health managementat home but she denies any current needs or concerns at this time.   She voiced understanding of needto seekreferral from primary care provider to [facility] care management if deemednecessaryand appropriate for any services in thefuture.  [facility] care management information was provided for future needs that patient may have.  Patient however, verbally agreed and opted for EMMI Pneumonia calls to follow-up with recovery at home. Referral made for EMMI Pneumonia calls after discharge.   For additional questions  please contact:  Edwena Felty A. Kohler Pellerito, BSN, RN-BC [facility] PRIMARY CARE Navigator Cell: [phone]

## 2017-12-10 ENCOUNTER — Encounter (HOSPITAL_COMMUNITY): Payer: Self-pay | Admitting: Radiology

## 2017-12-10 ENCOUNTER — Inpatient Hospital Stay (HOSPITAL_COMMUNITY): Payer: Medicare HMO

## 2017-12-10 DIAGNOSIS — J9601 Acute respiratory failure with hypoxia: Secondary | ICD-10-CM

## 2017-12-10 LAB — BRAIN NATRIURETIC PEPTIDE: B Natriuretic Peptide: 299.7 pg/mL — ABNORMAL HIGH (ref 0.0–100.0)

## 2017-12-10 LAB — EXPECTORATED SPUTUM ASSESSMENT W REFEX TO RESP CULTURE

## 2017-12-10 LAB — EXPECTORATED SPUTUM ASSESSMENT W GRAM STAIN, RFLX TO RESP C: Special Requests: NORMAL

## 2017-12-10 LAB — CBC
HCT: 35.3 % — ABNORMAL LOW (ref 36.0–46.0)
Hemoglobin: 11.6 g/dL — ABNORMAL LOW (ref 12.0–15.0)
MCH: 30.1 pg (ref 26.0–34.0)
MCHC: 32.9 g/dL (ref 30.0–36.0)
MCV: 91.7 fL (ref 78.0–100.0)
PLATELETS: 234 10*3/uL (ref 150–400)
RBC: 3.85 MIL/uL — ABNORMAL LOW (ref 3.87–5.11)
RDW: 14.2 % (ref 11.5–15.5)
WBC: 11.5 10*3/uL — ABNORMAL HIGH (ref 4.0–10.5)

## 2017-12-10 MED ORDER — GUAIFENESIN-DM 100-10 MG/5ML PO SYRP
5.0000 mL | ORAL_SOLUTION | ORAL | Status: DC | PRN
  Administered 2017-12-10 – 2017-12-11 (×2): 5 mL via ORAL
  Filled 2017-12-10 (×2): qty 5

## 2017-12-10 MED ORDER — IOPAMIDOL (ISOVUE-300) INJECTION 61%
INTRAVENOUS | Status: AC
  Administered 2017-12-10: 75 mL
  Filled 2017-12-10: qty 75

## 2017-12-10 MED ORDER — FUROSEMIDE 10 MG/ML IJ SOLN
40.0000 mg | Freq: Once | INTRAMUSCULAR | Status: AC
  Administered 2017-12-10: 40 mg via INTRAVENOUS
  Filled 2017-12-10: qty 4

## 2017-12-10 NOTE — Evaluation (Signed)
Occupational Therapy Evaluation Patient Details Name: Kathryn Chang MRN: [id number] DOB: 1941-04-06 Today's Date: 12/10/2017    History of Present Illness Pt is a 77 y/o female admitted secondary to a 4-5 day hx of cough with intermittent fevers. Pt found to have the flu. PMH including but not limited to a-fib.   Clinical Impression   Pt is at sup level with ADLs and ADL mobility. Pt will have assist for her husband at home. Pt educated on energy conservation techniques for home ADL and ADL mobility safety. All education completed and no further acute OT is indicated at this time    Follow Up Recommendations  No OT follow up;Supervision - Intermittent    Equipment Recommendations  None recommended by OT    Recommendations for Other Services       Precautions / Restrictions Precautions Precautions: Fall Restrictions Weight Bearing Restrictions: No      Mobility Bed Mobility Overal bed mobility: Needs Assistance Bed Mobility: Supine to Sit;Sit to Supine     Supine to sit: Modified independent (Device/Increase time) Sit to supine: Modified independent (Device/Increase time)      Transfers Overall transfer level: Needs assistance Equipment used: None Transfers: Sit to/from [facility] Sit to Stand: Supervision Stand pivot transfers: Supervision       General transfer comment: supervision for safety, no instability with movements    Balance Overall balance assessment: Needs assistance Sitting-balance support: Feet supported Sitting balance-Leahy Scale: Good     Standing balance support: During functional activity;No upper extremity supported Standing balance-Leahy Scale: Fair                             ADL either performed or assessed with clinical judgement   ADL Overall ADL's : Needs assistance/impaired Eating/Feeding: Independent;Sitting   Grooming: Wash/dry hands;Wash/dry face;Standing;Supervision/safety   Upper  Body Bathing: Supervision/ safety   Lower Body Bathing: Supervison/ safety   Upper Body Dressing : Supervision/safety   Lower Body Dressing: Supervision/safety   Toilet Transfer: Supervision/safety   Toileting- Clothing Manipulation and Hygiene: Supervision/safety   Tub/ Banker: Supervision/safety   Functional mobility during ADLs: Supervision/safety General ADL Comments: educated pt on energy conservation techniques during ADLs and ADL mobility     Vision Baseline Vision/History: Wears glasses Wears Glasses: Reading only Patient Visual Report: No change from baseline       Perception     Praxis      Pertinent Vitals/Pain Pain Assessment: No/denies pain     Hand Dominance Right   Extremity/Trunk Assessment Upper Extremity Assessment Upper Extremity Assessment: Overall WFL for tasks assessed   Lower Extremity Assessment Lower Extremity Assessment: Defer to PT evaluation   Cervical / Trunk Assessment Cervical / Trunk Assessment: Normal   Communication Communication Communication: No difficulties   Cognition Arousal/Alertness: Awake/alert Behavior During Therapy: WFL for tasks assessed/performed Overall Cognitive Status: Within Functional Limits for tasks assessed                                     General Comments       Exercises     Shoulder Instructions      Home Living   Living Arrangements: Spouse/significant other Available Help at Discharge: Family;Available PRN/intermittently Type of Home: House Home Access: Stairs to enter [employer] of Steps: 3 Entrance Stairs-Rails: Right;Left Home Layout: Able to live on main level with  bedroom/bathroom     Bathroom Shower/Tub: Chief Strategy Officer: Environmental consultant - 2 wheels;Cane - single point          Prior Functioning/Environment Level of Independence: Independent                 OT Problem List: Decreased activity tolerance;Impaired  balance (sitting and/or standing);Cardiopulmonary status limiting activity      OT Treatment/Interventions:      OT Goals(Current goals can be found in the care plan section) Acute Rehab OT Goals Patient Stated Goal: return home soon OT Goal Formulation: With patient  OT Frequency:     Barriers to D/C:    no barriers, husband can assist 24/7 prn       Co-evaluation              AM-PAC PT "6 Clicks" Daily Activity     Outcome Measure Help from another person eating meals?: None Help from another person taking care of personal grooming?: A Little Help from another person toileting, which includes using toliet, bedpan, or urinal?: A Little Help from another person bathing (including washing, rinsing, drying)?: A Little Help from another person to put on and taking off regular upper body clothing?: A Little Help from another person to put on and taking off regular lower body clothing?: A Little 6 Click Score: 19   End of Session Equipment Utilized During Treatment: Other (comment)(BSC)  Activity Tolerance: Patient tolerated treatment well Patient left: in bed  OT Visit Diagnosis: Other abnormalities of gait and mobility (R26.89)                Time: 9449-6759 OT Time Calculation (min): 27 min Charges:  OT General Charges $OT Visit: 1 Visit OT Evaluation $OT Eval Moderate Complexity: 1 Mod G-Codes: OT G-codes **NOT FOR INPATIENT CLASS** Functional Assessment Tool Used: AM-PAC 6 Clicks Daily Activity     Britt Bottom 12/10/2017, 11:47 AM

## 2017-12-10 NOTE — Progress Notes (Signed)
Currently attempting to wean off Oxygen per nasal cannula. O2 saturation at 94% on 4 LPM N/C. Will continue to monitor.

## 2017-12-10 NOTE — Progress Notes (Signed)
Pt ambulated on 6L O2 down entire hall, to the exit door.  She tolerated this well and O2 sats ranged from 88-96%.  Back to bed and placed on HFNC at 6L.  Pt states she is feeling much better and would eager to be able to go home soon.  Will continue to monitor.

## 2017-12-10 NOTE — Progress Notes (Addendum)
[facility]                                                                              Patient Demographics  Kathryn Chang, is a 77 y.o. female, DOB - 02-Sep-1941, TDS:[id number]  Admit date - 12/07/2017   Admitting Physician Toy Baker, MD  Outpatient Primary MD for the patient is [name], MD  Outpatient specialists:   LOS - 3  days   Medical records reviewed and are as summarized below:    Chief Complaint  Patient presents with  . Atrial Fibrillation       Brief summary   77 y.o.femalewith medical history significant  For a.fib on Eliquis,moderate aortic insufficiency, who presents with fever and atrial fibrillation with rapid ventricular response in the setting of sepsis. Patient admitted for possible sepsis secondary to acute viral bronchitis/probable early pneumonia and hypoxic respiratory failure. Chest x-ray did not show any active cardiopulmonary disease.  Assessment & Plan    Principal Problem:   Acute respiratory failure with hypoxia (HCC) likely due to right lower lung pneumonia, RSV bronchitis - Per patient, flu test at the PCPs office was positive for flu a however influenza panel inpatient on the same day negative - Respiratory virus panel positive for RSV, chest x-ray did not show acute cardiac process - Met sepsis criteria on admission with fever, tachycardia, hypotension, lactic acidosis - Currently still on high flow O2 92% on 6 L, wean as tolerated - 2-D echo showed EF of 50-55%, diffuse hypokinesis. Troponin 3 negative. - Unlikely to be PE as patient has been on eliquis and and states that she has been compliant - BNP 299.7, DC IV fluids, give 1 dose of Lasix 40 mg IV 1, reassess - Continue Zithromax, Rocephin, Xopenex nebs, Solu-Medrol, obtain CT chest for further workup  Active Problems:   Atrial fibrillation with RVR (HCC) - Currently rate controlled, wean Cardizem drip off, started on Toprol-XL - Continue  apixaban, [place] vasc 2   GERD - Continue Pepcid  Hypertension Continue metoprolol  Code Status: Full CODE STATUS DVT Prophylaxis:  apixaban Family Communication: Discussed in detail with the patient, all imaging results, lab results explained to the patient   Disposition Plan: Wean O2 as tolerated, then DC home  Time Spent in minutes 35 minutes  Procedures:    Consultants:   None   Antimicrobials:      Medications  Scheduled Meds: . apixaban  5 mg Oral BID  . azithromycin  250 mg Oral QHS  . chlorhexidine  15 mL Mouth Rinse BID  . famotidine  20 mg Oral BH-[id number]  . guaiFENesin  600 mg Oral BID  . magnesium oxide  400 mg Oral Daily  . mouth rinse  15 mL Mouth Rinse q12n4p  . methylPREDNISolone (SOLU-MEDROL) injection  40 mg Intravenous Q12H  . metoprolol succinate  50 mg Oral Daily   Continuous Infusions: . cefTRIAXone (ROCEPHIN)  IV Stopped (12/09/17 2131)  . diltiazem (CARDIZEM) infusion 7.5 mg/hr (12/08/17 2344)   PRN Meds:.acetaminophen **OR** acetaminophen, guaiFENesin-dextromethorphan, HYDROcodone-acetaminophen, levalbuterol, ondansetron **OR** ondansetron (ZOFRAN) IV   Antibiotics   Anti-infectives (From admission, onward)   Start  Dose/Rate Route Frequency Ordered Stop   12/09/17 2200  azithromycin (ZITHROMAX) tablet 250 mg     250 mg Oral Daily at bedtime 12/09/17 0857 12/14/17 2159   12/08/17 2200  cefTRIAXone (ROCEPHIN) 1 g in sodium chloride 0.9 % 100 mL IVPB     1 g 200 mL/hr over 30 Minutes Intravenous Every 24 hours 12/08/17 1344     12/08/17 2200  azithromycin (ZITHROMAX) 500 mg in sodium chloride 0.9 % 250 mL IVPB  Status:  Discontinued     500 mg 250 mL/hr over 60 Minutes Intravenous Every 24 hours 12/08/17 1344 12/09/17 0857   12/08/17 1000  vancomycin (VANCOCIN) IVPB 750 mg/150 ml premix  Status:  Discontinued     750 mg 150 mL/hr over 60 Minutes Intravenous Every 12 hours 12/08/17 0254 12/08/17 1135   12/08/17 0200  vancomycin  (VANCOCIN) IVPB 1000 mg/200 mL premix     1,000 mg 200 mL/hr over 60 Minutes Intravenous  Once 12/08/17 0152 12/08/17 0313   12/07/17 2315  cefTRIAXone (ROCEPHIN) 1 g in sodium chloride 0.9 % 100 mL IVPB  Status:  Discontinued     1 g 200 mL/hr over 30 Minutes Intravenous Every 24 hours 12/07/17 2309 12/08/17 1211   12/07/17 2315  azithromycin (ZITHROMAX) 500 mg in sodium chloride 0.9 % 250 mL IVPB  Status:  Discontinued     500 mg 250 mL/hr over 60 Minutes Intravenous Every 24 hours 12/07/17 2309 12/08/17 1211   12/07/17 2230  cefTRIAXone (ROCEPHIN) 1 g in sodium chloride 0.9 % 100 mL IVPB     1 g 200 mL/hr over 30 Minutes Intravenous  Once 12/07/17 2219 12/07/17 2307   12/07/17 2230  azithromycin (ZITHROMAX) tablet 500 mg     500 mg Oral  Once 12/07/17 2219 12/07/17 2237        Subjective:   Kathryn Chang was seen and examined today. No chest pain, O2 6 L high flow. No coughing, fevers or chills.  Patient denies dizziness, chest pain,  abdominal pain, N/V/D/C, new weakness, numbess, tingling.   Objective:   Vitals:   12/10/17 0010 12/10/17 0411 12/10/17 0700 12/10/17 0750  BP: 130/79 129/67  134/77  Pulse: 81 83  90  Resp: 20 20  20   Temp: 97.7 F (36.5 C) 98.2 F (36.8 C)  97.9 F (36.6 C)  TempSrc: Oral Oral  Axillary  SpO2: 90% 93%  91%  Weight:   81.9 kg (180 lb 9.6 oz)   Height:        Intake/Output Summary (Last 24 hours) at 12/10/2017 1159 Last data filed at 12/10/2017 0400 Gross per 24 hour  Intake 2090 ml  Output 1400 ml  Net 690 ml     Wt Readings from Last 3 Encounters:  12/10/17 81.9 kg (180 lb 9.6 oz)  09/23/17 77.2 kg (170 lb 3.2 oz)  03/17/17 79.4 kg (175 lb)     Exam  General: Alert and oriented x 3, NAD  Eyes:   HEENT:    Cardiovascular:  irregularly irregular   Respiratory: dec BS at bases   Gastrointestinal: Soft, nontender, nondistended, + bowel sounds  Ext: no pedal edema bilaterally  Neuro: no new  deficits  Musculoskeletal: No digital cyanosis, clubbing  Skin: No rashes  Psych: Normal affect and demeanor, alert and oriented x3    Data Reviewed:  I have personally reviewed following labs and imaging studies  Micro Results Recent Results (from the past 240 hour(s))  Culture, blood (routine x 2)  Status: None (Preliminary result)   Collection Time: 12/07/17  6:05 PM  Result Value Ref Range Status   Specimen Description BLOOD LEFT ANTECUBITAL  Final   Special Requests   Final    BOTTLES DRAWN AEROBIC AND ANAEROBIC Blood Culture adequate volume   Culture   Final    NO GROWTH 2 DAYS Performed at [facility], [address], [place], [place] [address]    Report Status PENDING  Incomplete  Culture, blood (routine x 2)     Status: None (Preliminary result)   Collection Time: 12/07/17  7:03 PM  Result Value Ref Range Status   Specimen Description BLOOD RIGHT ANTECUBITAL  Final   Special Requests   Final    BOTTLES DRAWN AEROBIC AND ANAEROBIC Blood Culture adequate volume   Culture   Final    NO GROWTH 2 DAYS Performed at [facility], [place] [address], [place], [place] [id number]    Report Status PENDING  Incomplete  Respiratory Panel by PCR     Status: Abnormal   Collection Time: 12/08/17 12:53 AM  Result Value Ref Range Status   Adenovirus NOT DETECTED NOT DETECTED Final   Coronavirus 229E NOT DETECTED NOT DETECTED Final   Coronavirus HKU1 NOT DETECTED NOT DETECTED Final   Coronavirus NL63 NOT DETECTED NOT DETECTED Final   Coronavirus OC43 NOT DETECTED NOT DETECTED Final   Metapneumovirus NOT DETECTED NOT DETECTED Final   Rhinovirus / Enterovirus NOT DETECTED NOT DETECTED Final   Influenza A NOT DETECTED NOT DETECTED Final   Influenza B NOT DETECTED NOT DETECTED Final   Parainfluenza Virus 1 NOT DETECTED NOT DETECTED Final   Parainfluenza Virus 2 NOT DETECTED NOT DETECTED Final   Parainfluenza Virus 3 NOT DETECTED NOT DETECTED Final   Parainfluenza Virus  4 NOT DETECTED NOT DETECTED Final   Respiratory Syncytial Virus DETECTED (A) NOT DETECTED Final    Comment: CRITICAL RESULT CALLED TO, READ BACK BY AND VERIFIED WITH: Gareth Eagle RN 12/08/17 0521 JDW    Bordetella pertussis NOT DETECTED NOT DETECTED Final   Chlamydophila pneumoniae NOT DETECTED NOT DETECTED Final   Mycoplasma pneumoniae NOT DETECTED NOT DETECTED Final    Comment: Performed at [facility], [place] [address], [place], [place] [address]  Culture, sputum-assessment     Status: None (Preliminary result)   Collection Time: 12/08/17  2:11 AM  Result Value Ref Range Status   Specimen Description EXPECTORATED SPUTUM  Final   Special Requests Normal  Final   Sputum evaluation   Final    THIS SPECIMEN IS ACCEPTABLE FOR SPUTUM CULTURE Performed at [facility], [place] [address] [place], [place] [address]    Report Status PENDING  Incomplete  Culture, respiratory (NON-Expectorated)     Status: None (Preliminary result)   Collection Time: 12/08/17  2:11 AM  Result Value Ref Range Status   Specimen Description EXPECTORATED SPUTUM  Final   Special Requests Normal Reflexed from [id number]  Final   Gram Stain   Final    RARE WBC PRESENT,BOTH PMN AND MONONUCLEAR FEW GRAM POSITIVE COCCI FEW GRAM NEGATIVE RODS RARE GRAM POSITIVE RODS    Culture   Final    CULTURE REINCUBATED FOR BETTER GROWTH Performed at [facility], [place] [address], [place], [place] [address]    Report Status PENDING  Incomplete  MRSA PCR Screening     Status: None   Collection Time: 12/09/17  3:31 AM  Result Value Ref Range Status  MRSA by PCR NEGATIVE NEGATIVE Final    Comment:        The GeneXpert MRSA Assay (FDA approved for NASAL specimens only), is one component of a comprehensive MRSA colonization surveillance program. It is not intended to diagnose MRSA infection nor to guide or monitor treatment for MRSA infections. Performed at [facility], [place] [address] [place], [place]  [address]     Radiology Reports Ct Abdomen Pelvis Wo Contrast  Result Date: 11/17/2017 CLINICAL DATA:  Hematuria. Prior appendectomy. History of left breast cancer. EXAM: CT ABDOMEN AND PELVIS WITHOUT CONTRAST TECHNIQUE: Multidetector CT imaging of the abdomen and pelvis was performed following the standard protocol without IV contrast. COMPARISON:  None. FINDINGS: Lower chest: Unremarkable. Hepatobiliary: 19 mm well-defined low-density lesion medial right liver approaches water attenuation and is likely a cyst. Gallbladder unremarkable. No intrahepatic or extrahepatic biliary dilation. Pancreas: No focal mass lesion. No dilatation of the main duct. No intraparenchymal cyst. No peripancreatic edema. Spleen: No splenomegaly. No focal mass lesion. Adrenals/Urinary Tract: No adrenal nodule or mass. 2 x 4 mm nonobstructing stone identified interpolar right kidney. 9 x 6 x 7 mm nonobstructing stone is present in the right renal pelvis. No gross mass lesion in the right kidney on this noncontrast study. No evidence for right ureteral stone. No left renal stone disease. No gross left renal mass. No secondary changes in the left kidney or ureter. No left ureteral stone. Bladder is decompressed without stone disease evident. Stomach/Bowel: Tiny hiatal hernia. Stomach otherwise unremarkable. Duodenum is normally positioned as is the ligament of Treitz. No small bowel wall thickening. No small bowel dilatation. The terminal ileum is normal. Nonvisualization of the appendix is consistent with the reported history of appendectomy. Diverticular changes are noted in the left colon without evidence of diverticulitis. Vascular/Lymphatic: No abdominal aortic aneurysm. There is no gastrohepatic or hepatoduodenal ligament lymphadenopathy. No intraperitoneal or retroperitoneal lymphadenopathy. No pelvic sidewall lymphadenopathy. Reproductive: The uterus has normal CT imaging appearance. There is no adnexal mass. Other: No  intraperitoneal free fluid. Musculoskeletal: Bone windows reveal no worrisome lytic or sclerotic osseous lesions. Superior endplate compression fracture noted at L1. IMPRESSION: 1. Nonobstructing stones identified in the interpolar right kidney and right renal pelvis. No other urinary stone disease evident. No secondary changes in either kidney or ureter. No evidence of bladder stones. 2. Right hepatic cyst. 3. L1 superior endplate compression fracture.  The Electronically Signed   By: Misty Stanley M.D.   On: 11/17/2017 12:21   Dg Chest 2 View  Result Date: 12/07/2017 CLINICAL DATA:  Cough, flu-like symptoms EXAM: CHEST  2 VIEW COMPARISON:  12/07/2017 FINDINGS: Heart and mediastinal contours are within normal limits. No focal opacities or effusions. No acute bony abnormality. Surgical clips in the left axilla. IMPRESSION: No active cardiopulmonary disease. Electronically Signed   By: Rolm Baptise M.D.   On: 12/07/2017 18:56   Dg Chest Port 1 View  Result Date: 12/09/2017 CLINICAL DATA:  Cough and shortness of breath EXAM: PORTABLE CHEST 1 VIEW COMPARISON:  December 07, 2017 FINDINGS: There is mild left base atelectasis. Lungs elsewhere are clear. Heart is borderline enlarged with pulmonary vascularity within normal limits. No adenopathy. There are surgical clips in left axillary region. No pneumothorax. No bone lesions. IMPRESSION: Mild left base atelectasis. Lungs elsewhere clear. Stable cardiac silhouette. Electronically Signed   By: Lowella Grip III M.D.   On: 12/09/2017 07:51    Lab Data:  CBC: Recent Labs  Lab 12/07/17 1808 12/08/17  0531 12/10/17 0455  WBC 9.2 7.0 11.5*  HGB 14.7 11.4* 11.6*  HCT 43.3 34.6* 35.3*  MCV 92.1 91.3 91.7  PLT 265 210 335   Basic Metabolic Panel: Recent Labs  Lab 12/07/17 1808 12/08/17 0531  NA 135 138  K 3.7 4.1  CL 101 107  CO2 21* 22  GLUCOSE 128* 126*  BUN 9 <5*  CREATININE 0.92 0.73  CALCIUM 8.8* 7.4*  MG  --  1.8  PHOS  --  1.9*    GFR: Estimated Creatinine Clearance: 62.5 mL/min (by C-G formula based on SCr of 0.73 mg/dL). Liver Function Tests: Recent Labs  Lab 12/08/17 0531  AST 42*  ALT 26  ALKPHOS 40  BILITOT 0.9  PROT 5.5*  ALBUMIN 2.6*   No results for input(s): LIPASE, AMYLASE in the last 168 hours. No results for input(s): AMMONIA in the last 168 hours. Coagulation Profile: Recent Labs  Lab 12/07/17 1808  INR 1.20   Cardiac Enzymes: Recent Labs  Lab 12/08/17 0041 12/08/17 0531 12/08/17 1247  TROPONINI <0.03 <0.03 <0.03   BNP (last 3 results) No results for input(s): PROBNP in the last 8760 hours. HbA1C: No results for input(s): HGBA1C in the last 72 hours. CBG: No results for input(s): GLUCAP in the last 168 hours. Lipid Profile: No results for input(s): CHOL, HDL, LDLCALC, TRIG, CHOLHDL, LDLDIRECT in the last 72 hours. Thyroid Function Tests: Recent Labs    12/08/17 0531  TSH 0.612   Anemia Panel: No results for input(s): VITAMINB12, FOLATE, FERRITIN, TIBC, IRON, RETICCTPCT in the last 72 hours. Urine analysis:    Component Value Date/Time   COLORURINE STRAW (A) 12/07/2017 2238   APPEARANCEUR CLEAR 12/07/2017 2238   LABSPEC 1.003 (L) 12/07/2017 2238   PHURINE 6.0 12/07/2017 2238   GLUCOSEU NEGATIVE 12/07/2017 2238   HGBUR SMALL (A) 12/07/2017 2238   BILIRUBINUR NEGATIVE 12/07/2017 2238   KETONESUR 20 (A) 12/07/2017 2238   PROTEINUR NEGATIVE 12/07/2017 2238   NITRITE NEGATIVE 12/07/2017 2238   LEUKOCYTESUR NEGATIVE 12/07/2017 2238     Ripudeep Rai M.D. [facility] 12/10/2017, 11:59 AM  Pager: [phone] Between 7am to 7pm - call Pager - [phone]  After 7pm go to [email or web address] - password [email or web address]  Call night coverage person covering after 7pm

## 2017-12-11 LAB — BASIC METABOLIC PANEL
ANION GAP: 13 (ref 5–15)
BUN: 17 mg/dL (ref 6–20)
CALCIUM: 8.2 mg/dL — AB (ref 8.9–10.3)
CO2: 23 mmol/L (ref 22–32)
Chloride: 105 mmol/L (ref 101–111)
Creatinine, Ser: 0.82 mg/dL (ref 0.44–1.00)
Glucose, Bld: 144 mg/dL — ABNORMAL HIGH (ref 65–99)
POTASSIUM: 3.5 mmol/L (ref 3.5–5.1)
SODIUM: 141 mmol/L (ref 135–145)

## 2017-12-11 LAB — CBC
HCT: 34.8 % — ABNORMAL LOW (ref 36.0–46.0)
Hemoglobin: 11.6 g/dL — ABNORMAL LOW (ref 12.0–15.0)
MCH: 30.1 pg (ref 26.0–34.0)
MCHC: 33.3 g/dL (ref 30.0–36.0)
MCV: 90.2 fL (ref 78.0–100.0)
PLATELETS: 274 10*3/uL (ref 150–400)
RBC: 3.86 MIL/uL — AB (ref 3.87–5.11)
RDW: 14.1 % (ref 11.5–15.5)
WBC: 11.9 10*3/uL — AB (ref 4.0–10.5)

## 2017-12-11 MED ORDER — AZITHROMYCIN 250 MG PO TABS
500.0000 mg | ORAL_TABLET | Freq: Every day | ORAL | Status: DC
  Administered 2017-12-11: 500 mg via ORAL
  Filled 2017-12-11: qty 2

## 2017-12-11 MED ORDER — FUROSEMIDE 10 MG/ML IJ SOLN
40.0000 mg | Freq: Every day | INTRAMUSCULAR | Status: DC
  Administered 2017-12-11 – 2017-12-12 (×2): 40 mg via INTRAVENOUS
  Filled 2017-12-11 (×2): qty 4

## 2017-12-11 NOTE — Plan of Care (Signed)
Patient was weaned off O2 in evening but during sleep sats dropped and was placed back on 2l.  Maintained sats during ambulation .  Patient should soon be able to be weaned off O2.

## 2017-12-11 NOTE — Progress Notes (Signed)
[facility]                                                                              Patient Demographics  Kathryn Chang, is a 77 y.o. female, DOB - Sep 02, 1941, CVU:[id number]  Admit date - 12/07/2017   Admitting Physician Toy Baker, MD  Outpatient Primary MD for the patient is [name], MD  Outpatient specialists:   LOS - 4  days   Medical records reviewed and are as summarized below:    Chief Complaint  Patient presents with  . Atrial Fibrillation       Brief summary   77 y.o.femalewith medical history significant  For a.fib on Eliquis,moderate aortic insufficiency, who presents with fever and atrial fibrillation with rapid ventricular response in the setting of sepsis. Patient admitted for possible sepsis secondary to acute viral bronchitis/probable early pneumonia and hypoxic respiratory failure. Chest x-ray did not show any active cardiopulmonary disease.  Assessment & Plan    Principal Problem:   Acute respiratory failure with hypoxia (HCC) likely due to right lower lung pneumonia, RSV bronchitis - Per patient, flu test at the PCPs office was positive for flu a however influenza panel inpatient on the same day negative. Respiratory virus panel positive for RSV, chest x-ray did not show acute cardiac process - Met sepsis criteria on admission with fever, tachycardia, hypotension, lactic acidosis - 2-D echo showed EF of 50-55%, diffuse hypokinesis. Troponin 3 negative. - Unlikely to be PE as patient has been on eliquis and and states that she has been compliant - BNP 299.7, patient received 1 dose of IV Lasix, feels a significant improvement in her breathing, I/O's with 803 mL down from 3.2 L positive yesterday. Will continue IV Lasix today - 02 requirements improving, was weaned down from high flow 6 L to 4 L overnight, down to room air - Increased Zithromax to 500 mg daily, continue IV  - CT chest showed dense airspace  consolidation involving the anterior and lateral left lower lobe compatible with pneumonia and/or aspiration, complete atelectasis of right middle lobe, subsegmental atelectasis in the right lower lobe, scattered areas of clustered tree-in-bud nodularity in the right lung consistent with inflammatory or infectious bronchiolitis. Await SLP evaluation - Home O2 evaluation in a.m.  Active Problems:   Atrial fibrillation with RVR (HCC) -Heart rate-controlled, Cardizem drip weaned off, continue Toprol-XL.  - Continue apixaban, [place] vasc 2   GERD - Continue Pepcid  Hypertension BP stable, continue metoprolol  Code Status: Full CODE STATUS DVT Prophylaxis:  apixaban Family Communication: Discussed in detail with the patient, all imaging results, lab results explained to the patient   Disposition Plan: Home O2 evaluation in a.m., likely DC home in a.m.  Time Spent in minutes 25 minutes  Procedures:    Consultants:   None   Antimicrobials:      Medications  Scheduled Meds: . apixaban  5 mg Oral BID  . azithromycin  500 mg Oral QHS  . chlorhexidine  15 mL Mouth Rinse BID  . famotidine  20 mg Oral BH-q7a  . furosemide  40 mg Intravenous Daily  . guaiFENesin  600 mg  Oral BID  . magnesium oxide  400 mg Oral Daily  . mouth rinse  15 mL Mouth Rinse q12n4p  . methylPREDNISolone (SOLU-MEDROL) injection  40 mg Intravenous Q12H  . metoprolol succinate  50 mg Oral Daily   Continuous Infusions: . cefTRIAXone (ROCEPHIN)  IV Stopped (12/10/17 2244)   PRN Meds:.acetaminophen **OR** acetaminophen, guaiFENesin-dextromethorphan, HYDROcodone-acetaminophen, levalbuterol, ondansetron **OR** ondansetron (ZOFRAN) IV   Antibiotics   Anti-infectives (From admission, onward)   Start     Dose/Rate Route Frequency Ordered Stop   12/11/17 2200  azithromycin (ZITHROMAX) tablet 500 mg     500 mg Oral Daily at bedtime 12/11/17 1241 12/14/17 2159   12/09/17 2200  azithromycin (ZITHROMAX) tablet  250 mg  Status:  Discontinued     250 mg Oral Daily at bedtime 12/09/17 0857 12/11/17 1241   12/08/17 2200  cefTRIAXone (ROCEPHIN) 1 g in sodium chloride 0.9 % 100 mL IVPB     1 g 200 mL/hr over 30 Minutes Intravenous Every 24 hours 12/08/17 1344     12/08/17 2200  azithromycin (ZITHROMAX) 500 mg in sodium chloride 0.9 % 250 mL IVPB  Status:  Discontinued     500 mg 250 mL/hr over 60 Minutes Intravenous Every 24 hours 12/08/17 1344 12/09/17 0857   12/08/17 1000  vancomycin (VANCOCIN) IVPB 750 mg/150 ml premix  Status:  Discontinued     750 mg 150 mL/hr over 60 Minutes Intravenous Every 12 hours 12/08/17 0254 12/08/17 1135   12/08/17 0200  vancomycin (VANCOCIN) IVPB 1000 mg/200 mL premix     1,000 mg 200 mL/hr over 60 Minutes Intravenous  Once 12/08/17 0152 12/08/17 0313   12/07/17 2315  cefTRIAXone (ROCEPHIN) 1 g in sodium chloride 0.9 % 100 mL IVPB  Status:  Discontinued     1 g 200 mL/hr over 30 Minutes Intravenous Every 24 hours 12/07/17 2309 12/08/17 1211   12/07/17 2315  azithromycin (ZITHROMAX) 500 mg in sodium chloride 0.9 % 250 mL IVPB  Status:  Discontinued     500 mg 250 mL/hr over 60 Minutes Intravenous Every 24 hours 12/07/17 2309 12/08/17 1211   12/07/17 2230  cefTRIAXone (ROCEPHIN) 1 g in sodium chloride 0.9 % 100 mL IVPB     1 g 200 mL/hr over 30 Minutes Intravenous  Once 12/07/17 2219 12/07/17 2307   12/07/17 2230  azithromycin (ZITHROMAX) tablet 500 mg     500 mg Oral  Once 12/07/17 2219 12/07/17 2237        Subjective:   Kathryn Chang was seen and examined today. Feels somewhat better today, was able to wean off O2 overnight however desatted again. No chest pain. No fevers or chills.  Patient denies dizziness, abdominal pain, N/V/D/C, new weakness, numbess, tingling.   Objective:   Vitals:   12/11/17 0428 12/11/17 0748 12/11/17 0902 12/11/17 1147  BP: 105/90  128/67 112/69  Pulse: 69 80  87  Resp:  20  18  Temp: 98 F (36.7 C) 98 F (36.7 C)  98 F  (36.7 C)  TempSrc: Oral Oral  Oral  SpO2: 92% 94%  92%  Weight: 79.6 kg (175 lb 8 oz)     Height:        Intake/Output Summary (Last 24 hours) at 12/11/2017 1241 Last data filed at 12/11/2017 0900 Gross per 24 hour  Intake 1300 ml  Output 3950 ml  Net -2650 ml     Wt Readings from Last 3 Encounters:  12/11/17 79.6 kg (175 lb 8 oz)  09/23/17  77.2 kg (170 lb 3.2 oz)  03/17/17 79.4 kg (175 lb)     Exam   General: Alert and oriented x 3, NAD  Eyes:   HEENT:  Atraumatic, normocephalic  Cardiovascular: S1 S2 auscultated, irregular, No pedal edema b/l  Respiratory: Bilateral scattered rhonchi  Gastrointestinal: Soft, nontender, nondistended, + bowel sounds  Ext: no pedal edema bilaterally  Neuro: no new deficits  Musculoskeletal: No digital cyanosis, clubbing  Skin: No rashes  Psych: Normal affect and demeanor, alert and oriented x3     Data Reviewed:  I have personally reviewed following labs and imaging studies  Micro Results Recent Results (from the past 240 hour(s))  Culture, blood (routine x 2)     Status: None (Preliminary result)   Collection Time: 12/07/17  6:05 PM  Result Value Ref Range Status   Specimen Description BLOOD LEFT ANTECUBITAL  Final   Special Requests   Final    BOTTLES DRAWN AEROBIC AND ANAEROBIC Blood Culture adequate volume   Culture   Final    NO GROWTH 3 DAYS Performed at [facility], [address] [place], [place] [address]    Report Status PENDING  Incomplete  Culture, blood (routine x 2)     Status: None (Preliminary result)   Collection Time: 12/07/17  7:03 PM  Result Value Ref Range Status   Specimen Description BLOOD RIGHT ANTECUBITAL  Final   Special Requests   Final    BOTTLES DRAWN AEROBIC AND ANAEROBIC Blood Culture adequate volume   Culture   Final    NO GROWTH 3 DAYS Performed at [facility], [place] [address], [place], [place] [address]    Report Status PENDING  Incomplete  Respiratory Panel by  PCR     Status: Abnormal   Collection Time: 12/08/17 12:53 AM  Result Value Ref Range Status   Adenovirus NOT DETECTED NOT DETECTED Final   Coronavirus 229E NOT DETECTED NOT DETECTED Final   Coronavirus HKU1 NOT DETECTED NOT DETECTED Final   Coronavirus NL63 NOT DETECTED NOT DETECTED Final   Coronavirus OC43 NOT DETECTED NOT DETECTED Final   Metapneumovirus NOT DETECTED NOT DETECTED Final   Rhinovirus / Enterovirus NOT DETECTED NOT DETECTED Final   Influenza A NOT DETECTED NOT DETECTED Final   Influenza B NOT DETECTED NOT DETECTED Final   Parainfluenza Virus 1 NOT DETECTED NOT DETECTED Final   Parainfluenza Virus 2 NOT DETECTED NOT DETECTED Final   Parainfluenza Virus 3 NOT DETECTED NOT DETECTED Final   Parainfluenza Virus 4 NOT DETECTED NOT DETECTED Final   Respiratory Syncytial Virus DETECTED (A) NOT DETECTED Final    Comment: CRITICAL RESULT CALLED TO, READ BACK BY AND VERIFIED WITH: Gareth Eagle RN 12/08/17 0521 JDW    Bordetella pertussis NOT DETECTED NOT DETECTED Final   Chlamydophila pneumoniae NOT DETECTED NOT DETECTED Final   Mycoplasma pneumoniae NOT DETECTED NOT DETECTED Final    Comment: Performed at [facility], [place] [address], [place], [place] [address]  Culture, sputum-assessment     Status: None   Collection Time: 12/08/17  2:11 AM  Result Value Ref Range Status   Specimen Description EXPECTORATED SPUTUM  Final   Special Requests Normal  Final   Sputum evaluation   Final    THIS SPECIMEN IS ACCEPTABLE FOR SPUTUM CULTURE Performed at [facility], [address] [place], [place] [address]    Report Status 12/10/2017 FINAL  Final  Culture, respiratory (NON-Expectorated)     Status:  None (Preliminary result)   Collection Time: 12/08/17  2:11 AM  Result Value Ref Range Status   Specimen Description EXPECTORATED SPUTUM  Final   Special Requests Normal Reflexed from [id number]  Final   Gram Stain   Final    RARE WBC PRESENT,BOTH PMN AND MONONUCLEAR FEW GRAM  POSITIVE COCCI FEW GRAM NEGATIVE RODS RARE GRAM POSITIVE RODS    Culture   Final    CULTURE REINCUBATED FOR BETTER GROWTH Performed at [facility], [place] [address], [place], [place] [address]    Report Status PENDING  Incomplete  MRSA PCR Screening     Status: None   Collection Time: 12/09/17  3:31 AM  Result Value Ref Range Status   MRSA by PCR NEGATIVE NEGATIVE Final    Comment:        The GeneXpert MRSA Assay (FDA approved for NASAL specimens only), is one component of a comprehensive MRSA colonization surveillance program. It is not intended to diagnose MRSA infection nor to guide or monitor treatment for MRSA infections. Performed at [facility], [place] [address] [place], [place] [address]     Radiology Reports Ct Abdomen Pelvis Wo Contrast  Result Date: 11/17/2017 CLINICAL DATA:  Hematuria. Prior appendectomy. History of left breast cancer. EXAM: CT ABDOMEN AND PELVIS WITHOUT CONTRAST TECHNIQUE: Multidetector CT imaging of the abdomen and pelvis was performed following the standard protocol without IV contrast. COMPARISON:  None. FINDINGS: Lower chest: Unremarkable. Hepatobiliary: 19 mm well-defined low-density lesion medial right liver approaches water attenuation and is likely a cyst. Gallbladder unremarkable. No intrahepatic or extrahepatic biliary dilation. Pancreas: No focal mass lesion. No dilatation of the main duct. No intraparenchymal cyst. No peripancreatic edema. Spleen: No splenomegaly. No focal mass lesion. Adrenals/Urinary Tract: No adrenal nodule or mass. 2 x 4 mm nonobstructing stone identified interpolar right kidney. 9 x 6 x 7 mm nonobstructing stone is present in the right renal pelvis. No gross mass lesion in the right kidney on this noncontrast study. No evidence for right ureteral stone. No left renal stone disease. No gross left renal mass. No secondary changes in the left kidney or ureter. No left ureteral stone. Bladder is decompressed without  stone disease evident. Stomach/Bowel: Tiny hiatal hernia. Stomach otherwise unremarkable. Duodenum is normally positioned as is the ligament of Treitz. No small bowel wall thickening. No small bowel dilatation. The terminal ileum is normal. Nonvisualization of the appendix is consistent with the reported history of appendectomy. Diverticular changes are noted in the left colon without evidence of diverticulitis. Vascular/Lymphatic: No abdominal aortic aneurysm. There is no gastrohepatic or hepatoduodenal ligament lymphadenopathy. No intraperitoneal or retroperitoneal lymphadenopathy. No pelvic sidewall lymphadenopathy. Reproductive: The uterus has normal CT imaging appearance. There is no adnexal mass. Other: No intraperitoneal free fluid. Musculoskeletal: Bone windows reveal no worrisome lytic or sclerotic osseous lesions. Superior endplate compression fracture noted at L1. IMPRESSION: 1. Nonobstructing stones identified in the interpolar right kidney and right renal pelvis. No other urinary stone disease evident. No secondary changes in either kidney or ureter. No evidence of bladder stones. 2. Right hepatic cyst. 3. L1 superior endplate compression fracture.  The Electronically Signed   By: Misty Stanley M.D.   On: 11/17/2017 12:21   Dg Chest 2 View  Result Date: 12/07/2017 CLINICAL DATA:  Cough, flu-like symptoms EXAM: CHEST  2 VIEW COMPARISON:  12/07/2017 FINDINGS: Heart and mediastinal contours are within normal limits. No focal opacities or effusions. No acute bony abnormality. Surgical clips in the left  axilla. IMPRESSION: No active cardiopulmonary disease. Electronically Signed   By: Rolm Baptise M.D.   On: 12/07/2017 18:56   Ct Chest W Contrast  Result Date: 12/10/2017 CLINICAL DATA:  Shortness of breath. EXAM: CT CHEST WITH CONTRAST TECHNIQUE: Multidetector CT imaging of the chest was performed during intravenous contrast administration. CONTRAST:  44m ISOVUE-300 IOPAMIDOL (ISOVUE-300) INJECTION  61% COMPARISON:  CT chest 03/04/2016 FINDINGS: Cardiovascular: Moderate cardiac enlargement. No pericardial effusion. Aortic atherosclerosis identified. Mediastinum/Nodes: Normal appearance of the thyroid gland. The trachea appears patent and is midline. Small hiatal hernia. Prominent mediastinal lymph nodes without adenopathy. Lungs/Pleura: Small left pleural effusion. There is complete atelectasis of the right middle lobe. New from previous exam. Dense airspace consolidation involving the anterior and lateral left lower lobe is identified, image 105 of series 7. Mild subsegmental atelectasis within the right lower lobe is identified. Right lower lobe bronchiectasis is noted. Ground-glass attenuating structure within the anterior right apex measures 3 cm, image 28 of series 7. Smaller ground-glass attenuating nodule within the anterolateral left lower lobe measures 7 mm, image 64 of series 7. Scattered multifocal areas of clustered tree-in-bud nodularity are identified in the right lung. For example, image 76 of series 7. Upper Abdomen: Cyst in right lobe of liver measures 1.9 cm. No acute abnormality identified. Musculoskeletal: Degenerative disc disease is identified within the lower thoracic spine. Unchanged L1 compression fracture. No suspicious bone lesions. IMPRESSION: 1. Dense airspace consolidation involving the anterior and lateral left lower lobe compatible with pneumonia and/or aspiration. Followup imaging is advised to ensure resolution and to rule out underlying malignancy. 2. Complete atelectasis of the right middle lobe. Subsegmental atelectasis in the right lower lobe also noted. Attention in these areas on follow-up imaging advised. 3. Scattered areas of clustered tree-in-bud nodularity predominantly involving the right lung identified, likely reflecting an inflammatory or infectious bronchiolitis. 4. Bilateral lower lobe bronchiectasis is likely the sequelae of recurrent, chronic inflammation.  Recurrent aspiration can also have this appearance. 5. Patchy multifocal areas of ground-glass attenuation are noted. Largest in the anterior right apex. 6.  Aortic Atherosclerosis (ICD10-I70.0). Electronically Signed   By: TKerby MoorsM.D.   On: 12/10/2017 19:37   Dg Chest Port 1 View  Result Date: 12/09/2017 CLINICAL DATA:  Cough and shortness of breath EXAM: PORTABLE CHEST 1 VIEW COMPARISON:  December 07, 2017 FINDINGS: There is mild left base atelectasis. Lungs elsewhere are clear. Heart is borderline enlarged with pulmonary vascularity within normal limits. No adenopathy. There are surgical clips in left axillary region. No pneumothorax. No bone lesions. IMPRESSION: Mild left base atelectasis. Lungs elsewhere clear. Stable cardiac silhouette. Electronically Signed   By: WLowella GripIII M.D.   On: 12/09/2017 07:51    Lab Data:  CBC: Recent Labs  Lab 12/07/17 1808 12/08/17 0531 12/10/17 0455 12/11/17 0458  WBC 9.2 7.0 11.5* 11.9*  HGB 14.7 11.4* 11.6* 11.6*  HCT 43.3 34.6* 35.3* 34.8*  MCV 92.1 91.3 91.7 90.2  PLT 265 210 234 2841  Basic Metabolic Panel: Recent Labs  Lab 12/07/17 1808 12/08/17 0531 12/11/17 0458  NA 135 138 141  K 3.7 4.1 3.5  CL 101 107 105  CO2 21* 22 23  GLUCOSE 128* 126* 144*  BUN 9 <5* 17  CREATININE 0.92 0.73 0.82  CALCIUM 8.8* 7.4* 8.2*  MG  --  1.8  --   PHOS  --  1.9*  --    GFR: Estimated Creatinine Clearance: 60.1 mL/min (by C-G formula based on SCr  of 0.82 mg/dL). Liver Function Tests: Recent Labs  Lab 12/08/17 0531  AST 42*  ALT 26  ALKPHOS 40  BILITOT 0.9  PROT 5.5*  ALBUMIN 2.6*   No results for input(s): LIPASE, AMYLASE in the last 168 hours. No results for input(s): AMMONIA in the last 168 hours. Coagulation Profile: Recent Labs  Lab 12/07/17 1808  INR 1.20   Cardiac Enzymes: Recent Labs  Lab 12/08/17 0041 12/08/17 0531 12/08/17 1247  TROPONINI <0.03 <0.03 <0.03   BNP (last 3 results) No results for  input(s): PROBNP in the last 8760 hours. HbA1C: No results for input(s): HGBA1C in the last 72 hours. CBG: No results for input(s): GLUCAP in the last 168 hours. Lipid Profile: No results for input(s): CHOL, HDL, LDLCALC, TRIG, CHOLHDL, LDLDIRECT in the last 72 hours. Thyroid Function Tests: No results for input(s): TSH, T4TOTAL, FREET4, T3FREE, THYROIDAB in the last 72 hours. Anemia Panel: No results for input(s): VITAMINB12, FOLATE, FERRITIN, TIBC, IRON, RETICCTPCT in the last 72 hours. Urine analysis:    Component Value Date/Time   COLORURINE STRAW (A) 12/07/2017 2238   APPEARANCEUR CLEAR 12/07/2017 2238   LABSPEC 1.003 (L) 12/07/2017 2238   PHURINE 6.0 12/07/2017 2238   GLUCOSEU NEGATIVE 12/07/2017 2238   HGBUR SMALL (A) 12/07/2017 2238   BILIRUBINUR NEGATIVE 12/07/2017 2238   KETONESUR 20 (A) 12/07/2017 2238   PROTEINUR NEGATIVE 12/07/2017 2238   NITRITE NEGATIVE 12/07/2017 2238   LEUKOCYTESUR NEGATIVE 12/07/2017 2238     Ripudeep Rai M.D. [facility] 12/11/2017, 12:41 PM  Pager: [phone] Between 7am to 7pm - call Pager - [phone]  After 7pm go to [email or web address] - password [email or web address]  Call night coverage person covering after 7pm

## 2017-12-11 NOTE — Evaluation (Signed)
Clinical/Bedside Swallow Evaluation Patient Details  Name: Kathryn Chang MRN: [id number] Date of Birth: July 25, 1941  Today's Date: 12/11/2017 Time: SLP Start Time (ACUTE ONLY): 1435 SLP Stop Time (ACUTE ONLY): 1450 SLP Time Calculation (min) (ACUTE ONLY): 15 min  Past Medical History:  Past Medical History:  Diagnosis Date  . Arthritis    "hands, feet" (12/08/2017)  . Atrial fibrillation ([place])   . Atrial fibrillation with RVR ([place]) 12/07/2017  . Breast cancer, left breast ([place]) 1987  . Pneumonia    "couple times" (12/08/2017)  . SBE (subacute bacterial endocarditis) prophylaxis candidate    for MR, AI   Past Surgical History:  Past Surgical History:  Procedure Laterality Date  . APPENDECTOMY    . BREAST BIOPSY Left 1987  . MASTECTOMY Left 1987   With immediate Reconstruction  . TUBAL LIGATION    . US ECHOCARDIOGRAPHY  10/2010   EF- 55-60%, mild  MR, mild AR   HPI:  Pt is a 77 y.o. female with PMH of a.fib on Eliquis,moderate aortic insufficiency. Presented to ED on 2/13 with 4-5-day history of cough and intermittent fevers presented today to her primary care provider states she was diagnosed with influenza Ain the officeand had a chest x-ray done showed right lower lobe pneumonia. She did have a flu shot this year. While at the office he had been noted to have A. fib with RVR with heart rates as high as 150s this was not associated with any chest pain patient was sent to emergency department [facility]. CXR 2-13 was negative, chest CT 2-16 showed "dense airspace consolidation involving the anterior and lateral left lower lobe compatible with pneumonia and/or aspiration. Complete atelectasis of right middle lobe, scattered areas of clustered tree-in-bud nodularity predominantly involving the right lung identified, likely reflecting an inflammatory or infectious bronchiolitis, bilateral lower lobe bronchiectasis is likely the sequelae of recurrent, chronic inflammation,  recurrent aspiration can also have this appearance." Bedside swallow eval ordered to rule out aspiration.   Assessment / Plan / Recommendation Clinical Impression  Pt showed no overt signs or symptoms of aspiration; swallow appeared timely, no changes in vocal quality, pt does not have a hx of dysphagia and no apparent cognitive changes that would cause concern. Pt stated she is not sure if she has reflux but has been prescribed Pepcid; however does not feel that she has symptoms of GERD. Pt also reported that recently whenever having a cold it seems to "feel deep" referring to having a congested cough. Current respiratory status may increase risk of aspiration. Recommend continuing regular diet, thin liquids, meds whole with liquid. SLP will sign off at this time; please re-consult if needs arise.  SLP Visit Diagnosis: Dysphagia, unspecified (R13.10)    Aspiration Risk  Mild aspiration risk    Diet Recommendation Regular;Thin liquid   Liquid Administration via: Cup;Straw Medication Administration: Whole meds with liquid Supervision: Patient able to self feed;Intermittent supervision to cue for compensatory strategies Compensations: Slow rate;Small sips/bites Postural Changes: Seated upright at 90 degrees    Other  Recommendations Oral Care Recommendations: Oral care BID   Follow up Recommendations None      Frequency and Duration            Prognosis        Swallow Study   General HPI: Pt is a 77 y.o. female with PMH of a.fib on Eliquis,moderate aortic insufficiency. Presented to ED on 2/13 with 4-5-day history of cough and intermittent fevers presented today to her primary  care provider states she was diagnosed with influenza Ain the officeand had a chest x-ray done showed right lower lobe pneumonia. She did have a flu shot this year. While at the office he had been noted to have A. fib with RVR with heart rates as high as 150s this was not associated with any chest pain  patient was sent to emergency department [facility]. CXR 2-13 was negative, chest CT 2-16 showed "dense airspace consolidation involving the anterior and lateral left lower lobe compatible with pneumonia and/or aspiration. Complete atelectasis of right middle lobe, scattered areas of clustered tree-in-bud nodularity predominantly involving the right lung identified, likely reflecting an inflammatory or infectious bronchiolitis, bilateral lower lobe bronchiectasis is likely the sequelae of recurrent, chronic inflammation, recurrent aspiration can also have this appearance." Bedside swallow eval ordered to rule out aspiration. Type of Study: Bedside Swallow Evaluation Previous Swallow Assessment: none in chart Diet Prior to this Study: Regular;Thin liquids Temperature Spikes Noted: No Respiratory Status: Nasal cannula History of Recent Intubation: No Behavior/Cognition: Alert;Cooperative;Pleasant mood Oral Cavity Assessment: Within Functional Limits Oral Care Completed by SLP: No Oral Cavity - Dentition: Adequate natural dentition Vision: Functional for self-feeding Self-Feeding Abilities: Able to feed self Patient Positioning: Upright in bed Baseline Vocal Quality: Normal Volitional Cough: Strong Volitional Swallow: Able to elicit    Oral/Motor/Sensory Function Overall Oral Motor/Sensory Function: Within functional limits   Ice Chips Ice chips: Not tested   Thin Liquid Thin Liquid: Within functional limits Presentation: Cup;Straw    Nectar Thick Nectar Thick Liquid: Not tested   Honey Thick Honey Thick Liquid: Not tested   Puree Puree: Within functional limits Presentation: Self Fed;Spoon   Solid   GO   Solid: Within functional limits Presentation: [place], [place], CCC-SLP 12/11/2017,2:59 PM  [phone]

## 2017-12-12 LAB — CBC
HEMATOCRIT: 38.7 % (ref 36.0–46.0)
HEMOGLOBIN: 13 g/dL (ref 12.0–15.0)
MCH: 30.3 pg (ref 26.0–34.0)
MCHC: 33.6 g/dL (ref 30.0–36.0)
MCV: 90.2 fL (ref 78.0–100.0)
Platelets: 319 10*3/uL (ref 150–400)
RBC: 4.29 MIL/uL (ref 3.87–5.11)
RDW: 13.9 % (ref 11.5–15.5)
WBC: 10.2 10*3/uL (ref 4.0–10.5)

## 2017-12-12 LAB — BASIC METABOLIC PANEL
Anion gap: 13 (ref 5–15)
BUN: 18 mg/dL (ref 6–20)
CHLORIDE: 104 mmol/L (ref 101–111)
CO2: 24 mmol/L (ref 22–32)
Calcium: 8.6 mg/dL — ABNORMAL LOW (ref 8.9–10.3)
Creatinine, Ser: 0.84 mg/dL (ref 0.44–1.00)
GFR calc Af Amer: >60 mL/min (ref >60)
GFR calc non Af Amer: >60 mL/min (ref >60)
GLUCOSE: 131 mg/dL — AB (ref 65–99)
POTASSIUM: 4.1 mmol/L (ref 3.5–5.1)
Sodium: 141 mmol/L (ref 135–145)

## 2017-12-12 LAB — CULTURE, BLOOD (ROUTINE X 2)
CULTURE: NO GROWTH
CULTURE: NO GROWTH
Special Requests: ADEQUATE
Special Requests: ADEQUATE

## 2017-12-12 MED ORDER — LEVALBUTEROL TARTRATE 45 MCG/ACT IN AERO: 1.0000 | INHALATION_SPRAY | RESPIRATORY_TRACT | 2 refills | Status: DC | PRN

## 2017-12-12 MED ORDER — CEFPODOXIME PROXETIL 100 MG PO TABS: 100.0000 mg | ORAL_TABLET | Freq: Two times a day (BID) | ORAL | 0 refills | Status: DC

## 2017-12-12 MED ORDER — MAGNESIUM OXIDE 400 (241.3 MG) MG PO TABS: 400.0000 mg | ORAL_TABLET | Freq: Every day | ORAL | 0 refills | Status: DC

## 2017-12-12 MED ORDER — GUAIFENESIN-DM 100-10 MG/5ML PO SYRP: 5.0000 mL | ORAL_SOLUTION | ORAL | 0 refills | Status: DC | PRN

## 2017-12-12 MED ORDER — PREDNISONE 10 MG PO TABS: ORAL_TABLET | ORAL | 0 refills | Status: DC

## 2017-12-12 MED ORDER — DILTIAZEM HCL ER COATED BEADS 120 MG PO CP24: 120.0000 mg | ORAL_CAPSULE | Freq: Every day | ORAL | 5 refills | Status: DC

## 2017-12-12 MED ORDER — CEFPODOXIME PROXETIL 200 MG PO TABS
200.0000 mg | ORAL_TABLET | Freq: Two times a day (BID) | ORAL | Status: DC
  Administered 2017-12-12: 200 mg via ORAL
  Filled 2017-12-12 (×2): qty 1

## 2017-12-12 MED ORDER — METOPROLOL SUCCINATE ER 50 MG PO TB24: 50.0000 mg | ORAL_TABLET | Freq: Every day | ORAL | 3 refills | Status: DC

## 2017-12-12 MED ORDER — CEFPODOXIME PROXETIL 200 MG PO TABS: 200.0000 mg | ORAL_TABLET | Freq: Two times a day (BID) | ORAL | 0 refills | Status: AC

## 2017-12-12 MED ORDER — DILTIAZEM HCL 60 MG PO TABS
30.0000 mg | ORAL_TABLET | Freq: Four times a day (QID) | ORAL | Status: DC
  Administered 2017-12-12 (×2): 30 mg via ORAL
  Filled 2017-12-12 (×3): qty 1

## 2017-12-12 MED ORDER — FUROSEMIDE 20 MG PO TABS: 20.0000 mg | ORAL_TABLET | Freq: Every day | ORAL | 0 refills | Status: DC

## 2017-12-12 MED ORDER — GUAIFENESIN ER 600 MG PO TB12: 600.0000 mg | ORAL_TABLET | Freq: Two times a day (BID) | ORAL | 0 refills | Status: DC | PRN

## 2017-12-12 MED ORDER — AZITHROMYCIN 500 MG PO TABS: 500.0000 mg | ORAL_TABLET | Freq: Every day | ORAL | 0 refills | Status: AC

## 2017-12-12 NOTE — Discharge Summary (Signed)
Physician Discharge Summary   Patient ID: Kathryn Chang MRN: [id number] DOB/AGE: 1941/08/25 77 y.o.  Admit date: 12/07/2017 Discharge date: 12/12/2017  Primary Care Physician:  [name], MD  Discharge Diagnoses:   . Acute respiratory failure with hypoxia ([place]) . Atrial fibrillation with RVR ([place]) . Sepsis ([place]) . RLL pneumonia ([place]) . RSV bronchitis GERD Hypertension   Consults: None  Recommendations for Outpatient Follow-up:  1. Please repeat CBC/BMET at next visit   DIET: Heart healthy diet    Allergies:   Allergies  Allergen Reactions  . Shellfish-Derived Products Anaphylaxis    Pass out and incontinent   . Tamiflu [Oseltamivir Phosphate]     Total body rash     DISCHARGE MEDICATIONS: Allergies as of 12/12/2017      Reactions   Shellfish-derived Products Anaphylaxis   Pass out and incontinent    Tamiflu [oseltamivir Phosphate]    Total body rash      Medication List    TAKE these medications   acetaminophen 500 MG tablet Commonly known as:  TYLENOL Take 1,000 mg by mouth every 8 (eight) hours as needed for mild pain.   apixaban 5 MG Tabs tablet Commonly known as:  ELIQUIS Take 1 tablet (5 mg total) by mouth 2 (two) times daily.   azithromycin 500 MG tablet Commonly known as:  ZITHROMAX Take 1 tablet (500 mg total) by mouth daily for 5 days.   CALCIUM + D PO Take 600 mg by mouth 2 (two) times daily.   cefpodoxime 200 MG tablet Commonly known as:  VANTIN Take 1 tablet (200 mg total) by mouth 2 (two) times daily for 5 days.   diltiazem 120 MG 24 hr capsule Commonly known as:  CARDIZEM CD Take 1 capsule (120 mg total) by mouth daily.   famotidine 20 MG tablet Commonly known as:  PEPCID One at bedtime What changed:    how much to take  how to take this  when to take this  additional instructions   furosemide 20 MG tablet Commonly known as:  LASIX Take 1 tablet (20 mg total) by mouth daily.   guaiFENesin 600 MG 12 hr  tablet Commonly known as:  MUCINEX Take 1 tablet (600 mg total) by mouth 2 (two) times daily as needed for cough or to loosen phlegm.   guaiFENesin-dextromethorphan 100-10 MG/5ML syrup Commonly known as:  ROBITUSSIN DM Take 5 mLs by mouth every 4 (four) hours as needed for cough.   levalbuterol 45 MCG/ACT inhaler Commonly known as:  XOPENEX HFA Inhale 1 puff into the lungs every 4 (four) hours as needed for wheezing or shortness of breath.   magnesium oxide 400 (241.3 Mg) MG tablet Commonly known as:  MAG-OX Take 1 tablet (400 mg total) by mouth daily.   metoprolol succinate 50 MG 24 hr tablet Commonly known as:  TOPROL-XL Take 1 tablet (50 mg total) by mouth daily. What changed:    medication strength  how much to take   MULTIVITAMIN PO Take 1 tablet by mouth daily.   NON FORMULARY Apply 1 application topically daily as needed (pain in feet). [place]  Anti-Inflammatory Cream- Diclofenac 3%, Baclofen 2%, Lidocaine 2% Apply 1-2 grams to affected area 3-4 times daily Qty. 120 gm 3 refills   predniSONE 10 MG tablet Commonly known as:  DELTASONE Prednisone dosing: Take  Prednisone 25m (4 tabs) x 2 days, then taper to 339m(3 tabs) x 2 days, then 2057m2 tabs) x  2 days, then 71m81m  1 tab) x 2 days, then OFF.   RECLAST 5 MG/100ML Soln injection Generic drug:  zoledronic acid Inject 5 mg into the vein See admin instructions. Yearly   VITAMIN D PO Take 2,000 Units by mouth daily.        Brief H and P: For complete details please refer to admission H and P, but in brief 77 y.o.femalewith medical history significant For a.fib on Eliquis,moderate aortic insufficiency, who presents with fever and atrial fibrillation with rapid ventricular responsein the setting of sepsis. Patient admitted for possible sepsis secondary toacuteviralbronchitis/probable early pneumonia andhypoxic respiratory failure. Chest x-ray did not show any active cardiopulmonary  disease.  Hospital Course:    Acute respiratory failure with hypoxia (HCC) likely due to right lower lung pneumonia, RSV bronchitis - Per patient, flu test at the PCPs office was positive for flu a however influenza panel inpatient on the same day negative. Respiratory virus panel positive for RSV, chest x-ray did not show acute cardiac process - Met sepsis criteria on admission with fever, tachycardia, hypotension, lactic acidosis - 2-D echo showed EF of 50-55%, diffuse hypokinesis. Troponin 3 negative. - Unlikely to be PE as patient has been on eliquis and and states that she has been compliant - BNP 299.7,  patient received IV Lasix with significant improvement in her breathing, was able to wean off O2.   - CT chest showed dense airspace consolidation involving the anterior and lateral left lower lobe compatible with pneumonia and/or aspiration, complete atelectasis of right middle lobe, subsegmental atelectasis in the right lower lobe, scattered areas of clustered tree-in-bud nodularity in the right lung consistent with inflammatory or infectious bronchiolitis.  -SLP evaluation done, mild aspiration risk - Continue oral Zithromax and-Vantin for 5 more days -Home O2 evaluation done, does not qualify for oxygen, patient doing well, on ambulation O2 sat remained 94% on room air. -Patient was recommended to follow-up with her pulmonologist, Dr. Melvyn Novas    Atrial fibrillation with RVR ([place]) -During hospitalization, patient had atrial for ablation with RVR and was placed on Cardizem drip, subsequently weaned off.  Toprol-XL was increased to 50 mg daily  -Patient went back in RVR on the a.m. of 2/18 was started on oral Cardizem 30 mg every 6 hours with improvement in the heart rate.  Cardizem was changed to long-acting 120 mg daily from 2/19 - Continue apixaban, [place] vasc 2  -Patient was recommended to follow-up with her cardiologist Dr. [name] within 2 weeks.  Continue Lasix 20 mg daily until  follow-up with Dr. [name], will need to bmet in 1 week  GERD - Continue Pepcid  Hypertension BP stable, continue metoprolol and Cardizem   Day of Discharge Off O2, feels well, no palpitations.  Heart rate on ambulation increased to 140 during home O2 eval, on rest in 80's, asymptomatic.  Patient wants to go home.   BP 133/83   Pulse 100   Temp 98.5 F (36.9 C) (Oral)   Resp 20   Ht _0  (1.6 m)   Wt 77.6 kg (171 lb)   SpO2 93%   BMI 30.29 kg/m   Physical Exam: General: Alert and awake oriented x3 not in any acute distress. HEENT: anicteric sclera, pupils reactive to light and accommodation CVS: S1-S2 clear, irregularly regular Chest: clear to auscultation bilaterally, no wheezing rales or rhonchi Abdomen: soft nontender, nondistended, normal bowel sounds Extremities: no cyanosis, clubbing or edema noted bilaterally Neuro: Cranial nerves II-XII intact, no focal neurological deficits   The results of  significant diagnostics from this hospitalization (including imaging, microbiology, ancillary and laboratory) are listed below for reference.    LAB RESULTS: Basic Metabolic Panel: Recent Labs  Lab 12/08/17 0531 12/11/17 0458 12/12/17 0515  NA 138 141 141  K 4.1 3.5 4.1  CL 107 105 104  CO2 _0 GLUCOSE 126* 144* 131*  BUN <5* 17 18  CREATININE 0.73 0.82 0.84  CALCIUM 7.4* 8.2* 8.6*  MG 1.8  --   --   PHOS 1.9*  --   --    Liver Function Tests: Recent Labs  Lab 12/08/17 0531  AST 42*  ALT 26  ALKPHOS 40  BILITOT 0.9  PROT 5.5*  ALBUMIN 2.6*   No results for input(s): LIPASE, AMYLASE in the last 168 hours. No results for input(s): AMMONIA in the last 168 hours. CBC: Recent Labs  Lab 12/11/17 0458 12/12/17 0515  WBC 11.9* 10.2  HGB 11.6* 13.0  HCT 34.8* 38.7  MCV 90.2 90.2  PLT 274 319   Cardiac Enzymes: Recent Labs  Lab 12/08/17 0531 12/08/17 1247  TROPONINI <0.03 <0.03   BNP: Invalid input(s): POCBNP CBG: No results for  input(s): GLUCAP in the last 168 hours.  Significant Diagnostic Studies:  Dg Chest 2 View  Result Date: 12/07/2017 CLINICAL DATA:  Cough, flu-like symptoms EXAM: CHEST  2 VIEW COMPARISON:  12/07/2017 FINDINGS: Heart and mediastinal contours are within normal limits. No focal opacities or effusions. No acute bony abnormality. Surgical clips in the left axilla. IMPRESSION: No active cardiopulmonary disease. Electronically Signed   By: Rolm Baptise M.D.   On: 12/07/2017 18:56    2D ECHO:  Study Conclusions  - Left ventricle: Diffuse hypokinesis worse in the mid and basal   inferior wall. The cavity size was normal. Wall thickness was   normal. Systolic function was normal. The estimated ejection   fraction was in the range of 50% to 55%. Diffuse hypokinesis. - Aortic valve: There was mild to moderate regurgitation. - Mitral valve: There was mild regurgitation. - Left atrium: The atrium was moderately dilated. - Right atrium: The atrium was moderately dilated. - Atrial septum: No defect or patent foramen ovale was identified. - Pulmonary arteries: PA peak pressure: 47 mm Hg (S).  Disposition and Follow-up: Discharge Instructions    Diet - low sodium heart healthy   Complete by:  As directed    Increase activity slowly   Complete by:  As directed        DISPOSITION: home    [place]    Little, Lennette Bihari, MD. Schedule an appointment as soon as possible for a visit in 1 week(s).   Specialty:  Family Medicine Why:  need BMET for potassium and renal function Contact information: [place] [address] [phone]        Tanda Rockers, MD. Schedule an appointment as soon as possible for a visit in 2 week(s).   Specialty:  Pulmonary Disease Contact information: [address] [place] [address] [phone]        Dorothy Spark, MD. Schedule an appointment as soon as possible for a visit in 2 week(s).    Specialty:  Cardiology Contact information: [place] [phone]            Time spent on Discharge: 40 mins   Signed:   Estill Cotta M.D. [facility] 12/12/2017, 2:36 PM Pager: [phone]

## 2017-12-12 NOTE — Progress Notes (Signed)
SATURATION QUALIFICATIONS: (This note is used to comply with regulatory documentation for home oxygen)  Patient Saturations on Room Air at Rest = 96-99%  Patient Saturations on Room Air while Ambulating = 94%  Patient Saturations on 0 Liters of oxygen while Ambulating = %  Please briefly explain why patient needs home oxygen: pt sats 96-99% on RA at rest before walking 94% on RA while walking, walked 140 feet.  Pt HR up to 140-157 while walking, highest HR 163 after returning to room and sitting down, HR came down to 100-110's.

## 2017-12-12 NOTE — Progress Notes (Addendum)
HR down to 70-90's.  Discharge instructions reviewed with pt and her daughter.  Copy of instructions, handouts and scripts given to pt.  Pt d/c'd via wheelchair with belongings, with daughter.             Escorted by unit NT.

## 2017-12-14 LAB — CULTURE, RESPIRATORY: SPECIAL REQUESTS: NORMAL

## 2017-12-14 LAB — CULTURE, RESPIRATORY W GRAM STAIN: Culture: NORMAL

## 2017-12-19 ENCOUNTER — Other Ambulatory Visit: Payer: Self-pay | Admitting: *Deleted

## 2017-12-19 NOTE — Patient Outreach (Signed)
[facility]) Care Management  12/19/2017  Yarianna Varble Maimone 05-Aug-1941 [id number]  EMMI-Pneumonia Red Alert Referral-Day#3 on 12/16/2017; Reason_Been to follow up appointment-"no" Had diarrhea or felt sick to stomach-"yes"  Telephone call to patient who was advised of reason for call. HIPPA verification received.  Patient voices that she is not having any more diarrhea & nausea was only slight. Thinks she had diarrhea because of antibiotic. States she is okay now. Voices that she has scheduled appointment for hospital follow up this week 12/22/2017. States spouse will take her to appointment.   Advised patient of importance of notifying MD if she feels symptoms of pneumonia are worsening. Patient voices understanding & advised that she has a list of symptoms that was reviewed on discharge & knows when to call MD. States she is currently no having symptoms. Voices she also knows when to call 911 for emergency symptoms.  States she taking medications as prescribed and understands importance of doing so. States no further concerns at this time.  EMMI referral has been addressed.  Plan: Send to care management assistant for case closure.   Sherrin Daisy, RN BSN [place] Management Coordinator [facility] Care Management  [phone]

## 2017-12-21 ENCOUNTER — Ambulatory Visit: Payer: Medicare HMO | Admitting: Cardiology

## 2017-12-22 ENCOUNTER — Ambulatory Visit: Payer: Medicare HMO | Admitting: Cardiology

## 2017-12-22 ENCOUNTER — Encounter: Payer: Self-pay | Admitting: Cardiology

## 2017-12-22 VITALS — BP 120/94 | HR 59 | Ht 63.0 in | Wt 168.0 lb

## 2017-12-22 DIAGNOSIS — R0689 Other abnormalities of breathing: Secondary | ICD-10-CM | POA: Diagnosis not present

## 2017-12-22 DIAGNOSIS — E8809 Other disorders of plasma-protein metabolism, not elsewhere classified: Secondary | ICD-10-CM | POA: Diagnosis not present

## 2017-12-22 DIAGNOSIS — R8290 Unspecified abnormal findings in urine: Secondary | ICD-10-CM | POA: Diagnosis not present

## 2017-12-22 DIAGNOSIS — I4891 Unspecified atrial fibrillation: Secondary | ICD-10-CM

## 2017-12-22 DIAGNOSIS — I5043 Acute on chronic combined systolic (congestive) and diastolic (congestive) heart failure: Secondary | ICD-10-CM | POA: Diagnosis not present

## 2017-12-22 DIAGNOSIS — I351 Nonrheumatic aortic (valve) insufficiency: Secondary | ICD-10-CM

## 2017-12-22 DIAGNOSIS — I10 Essential (primary) hypertension: Secondary | ICD-10-CM

## 2017-12-22 DIAGNOSIS — R899 Unspecified abnormal finding in specimens from other organs, systems and tissues: Secondary | ICD-10-CM | POA: Diagnosis not present

## 2017-12-22 DIAGNOSIS — J181 Lobar pneumonia, unspecified organism: Secondary | ICD-10-CM | POA: Diagnosis not present

## 2017-12-22 DIAGNOSIS — R748 Abnormal levels of other serum enzymes: Secondary | ICD-10-CM | POA: Diagnosis not present

## 2017-12-22 MED ORDER — METOPROLOL SUCCINATE ER 25 MG PO TB24: 75.0000 mg | ORAL_TABLET | Freq: Every day | ORAL | 1 refills | Status: DC

## 2017-12-22 NOTE — Patient Instructions (Signed)
Medication Instructions:   STOP TAKING LASIX IN ONE WEEK  INCREASE YOUR METOPROLOL SUCCINATE (TOPROL XL) TO 75 MG ONCE DAILY     Follow-Up:  1 MONTH WITH AN EXTENDER IN OUR OFFICE       If you need a refill on your cardiac medications before your next appointment, please call your pharmacy.

## 2017-12-22 NOTE — Progress Notes (Signed)
Cardiology Office Note   Date:  12/24/2017   ID:  [name], DOB 11-Aug-1941, MRN [id number]  PCP:  [name], MD  Cardiologist: Darlin Coco MD --[place]  Chief complain: 6 months follow-up   History of Present Illness: Kathryn Chang is a 77 y.o. female who presents for scheduled follow-up visit. She has a history of established atrial fibrillation and is on long-term Eliquis. The patient had an echocardiogram on 01/01/15 which showed normal left ventricular systolic function with an ejection fraction of 55-60%. There was moderate aortic insufficiency and there was biatrial enlargement. She was switched from digoxin to metoprolol for rate control. Since last visit she's had no new cardiac symptoms. She recently traveled to [place] and they were able to administer to a lot of the local people there.  03/17/2017  - this is 6 months follow-up, she denies any palpitations dizziness or syncope. She gets occasional lower extremity edema. She has no bleeding with Eliquis and is compliant with all her medications. She denies any orthopnea or paroxysmal nocturnal dyspnea chest pain or shortness of breath. She is complaining of gaining weight, she doesn't exercise.   12/22/2017 - 9 months follow up, the patient is coming hospitalization on 2/13-18 for influenza A complicated by by RLL pneumonia. While in the hospital she was in a-fib with RVR treated with Cardizem drip --> PO cardizem and Toprol XL. In the hospital she was also diagnosed with RSV and sepsis.  Echo showed LVEF 50-55% with diffuse hypokinesis. Minimally elevated BNP at 299 and she received iv Lasix with improved SOB.   She feels significantly better, no ongoing DOE, mild residual LE edema, no orthopnea, PND, HR continues to be slightly elevated around 100.  Past Medical History:  Diagnosis Date  . Arthritis    "hands, feet" (12/08/2017)  . Atrial fibrillation ([place])   . Atrial fibrillation  with RVR ([place]) 12/07/2017  . Breast cancer, left breast ([place]) 1987  . Pneumonia    "couple times" (12/08/2017)  . SBE (subacute bacterial endocarditis) prophylaxis candidate    for MR, AI   Past Surgical History:  Procedure Laterality Date  . APPENDECTOMY    . BREAST BIOPSY Left 1987  . MASTECTOMY Left 1987   With immediate Reconstruction  . TUBAL LIGATION    . US ECHOCARDIOGRAPHY  10/2010   EF- 55-60%, mild  MR, mild AR   Current Outpatient Medications  Medication Sig Dispense Refill  . acetaminophen (TYLENOL) 500 MG tablet Take 1,000 mg by mouth every 8 (eight) hours as needed for mild pain.     Marland Kitchen apixaban (ELIQUIS) 5 MG TABS tablet Take 1 tablet (5 mg total) by mouth 2 (two) times daily. 180 tablet 3  . Calcium Carbonate-Vitamin D (CALCIUM + D PO) Take 600 mg by mouth 2 (two) times daily.     . Cholecalciferol (VITAMIN D PO) Take 2,000 Units by mouth daily.      Marland Kitchen diltiazem (CARDIZEM CD) 120 MG 24 hr capsule Take 1 capsule (120 mg total) by mouth daily. 30 capsule 5  . famotidine (PEPCID) 20 MG tablet One at bedtime    . levalbuterol (XOPENEX HFA) 45 MCG/ACT inhaler Inhale 1 puff into the lungs every 4 (four) hours as needed for wheezing or shortness of breath. 1 Inhaler 2  . magnesium oxide (MAG-OX) 400 (241.3 Mg) MG tablet Take 1 tablet (400 mg total) by mouth daily. 30 tablet 0  . Multiple Vitamin (MULTIVITAMIN PO) Take 1 tablet  by mouth daily.     . zoledronic acid (RECLAST) 5 MG/100ML SOLN injection Inject 5 mg into the vein See admin instructions. Yearly     . metoprolol succinate (TOPROL XL) 25 MG 24 hr tablet Take 3 tablets (75 mg total) by mouth daily. 180 tablet 1   No current facility-administered medications for this visit.     Allergies:   Shellfish-derived products and Tamiflu [oseltamivir phosphate]    Social History:  The patient  reports that  has never smoked. she has never used smokeless tobacco. She reports that she drinks about 1.2 oz of alcohol per week.  She reports that she does not use drugs.   Family History:  The patient's family history includes Heart disease in her father and mother; Hypertension in her father and mother.    ROS:  Please see the history of present illness.   Otherwise, review of systems are positive for none.   All other systems are reviewed and negative.    PHYSICAL EXAM: VS:  BP (!) 120/94 (BP Location: Left Arm, Patient Position: Sitting, Cuff Size: Normal)   Pulse (!) 59   Ht 5\' 3"  (1.6 m)   Wt 168 lb (76.2 kg)   SpO2 97%   BMI 29.76 kg/m  , BMI Body mass index is 29.76 kg/m. GEN: Well nourished, well developed, in no acute distress  HEENT: normal  Neck: no JVD, carotid bruits, or masses Cardiac: Irregularly irregular.  Mild diastolic murmur of aortic insufficiency.  No gallop., rubs, or ,no edema  Respiratory:  clear to auscultation bilaterally, normal work of breathing GI: soft, nontender, nondistended, + BS MS: no deformity or atrophy  Skin: warm and dry, no rash Neuro:  Strength and sensation are intact Psych: euthymic mood, full affect  EKG:  EKG is ordered today. It shows a-fib with RVR 105 BPM, HR is higher than previously.  TTE: 12/2014  - Left ventricle: The cavity size was normal. Wall thickness was normal. Systolic function was normal. The estimated ejection fraction was in the range of 55% to 60%. Wall motion was normal; there were no regional wall motion abnormalities. - Aortic valve: There was moderate regurgitation. - Left atrium: The atrium was severely dilated. - Right atrium: The atrium was moderately dilated. - Pulmonary arteries: Systolic pressure was moderately increased. PA peak pressure: 42 mm Hg (S).  Recent Labs: 12/08/2017: ALT 26; Magnesium 1.8; TSH 0.612 12/10/2017: B Natriuretic Peptide 299.7 12/12/2017: BUN 18; Creatinine, Ser 0.84; Hemoglobin 13.0; Platelets 319; Potassium 4.1; Sodium 141   Lipid Panel No results found for: CHOL, TRIG, HDL, CHOLHDL, VLDL,  LDLCALC, LDLDIRECT   Wt Readings from Last 3 Encounters:  12/22/17 168 lb (76.2 kg)  12/12/17 171 lb (77.6 kg)  09/23/17 170 lb 3.2 oz (77.2 kg)    TTE: 12/2014 - Left ventricle: The cavity size was normal. Wall thickness was normal. Systolic function was normal. The estimated ejection fraction was in the range of 55% to 60%. Wall motion was normal; there were no regional wall motion abnormalities. - Aortic valve: There was moderate regurgitation. - Left atrium: The atrium was severely dilated. - Right atrium: The atrium was moderately dilated. - Pulmonary arteries: Systolic pressure was moderately increased. PA peak pressure: 42 mm Hg (S).  TTE: 03/02/2017 Left ventricle: The cavity size was normal. Systolic function was   normal. The estimated ejection fraction was in the range of 55%   to 60%. Wall motion was normal; there were no regional wall  motion abnormalities. - Aortic valve: Transvalvular velocity was within the normal range.   There was no stenosis. There was mild regurgitation. - Aorta: Ascending aortic diameter: 38 mm (S). - Ascending aorta: The ascending aorta was mildly dilated. - Mitral valve: Transvalvular velocity was within the normal range.   There was no evidence for stenosis. There was mild regurgitation. - Left atrium: The atrium was severely dilated. - Right ventricle: The cavity size was mildly dilated. Wall   thickness was normal. Systolic function was normal. - Right atrium: The atrium was mildly dilated. - Atrial septum: No defect or patent foramen ovale was identified. - Tricuspid valve: There was mild regurgitation. - Pulmonary arteries: Systolic pressure was within the normal   range. PA peak pressure: 32 mm Hg (S).    ASSESSMENT AND PLAN:  1. Acute on chronic diastolic CHF - sec to recent FluA, RSV with sepsis and pneumonia - continue Lasix 20 mg po daily for 1 more week, then d/c  2. A-fib with RVR - in persistent a-fib - now with  RVR - I will increase toprol to 75 mg PO daily, continue cardizem 120 mg po daily  3. Moderate aortic insufficiency by echocardiogram - most recent echocardiogram performed on 03/02/2017 showed only mild aortic regurgitation. Left ventricular size remains normal.   4. Hypertension - well controlled.  Follow-up in 1 month.  Signed, Ena Dawley, MD 12/24/2017 6:49 PM    [facility] [place], [place], [place]  [address] Phone: [phone]; Fax: [phone]

## 2017-12-28 ENCOUNTER — Ambulatory Visit: Payer: Medicare HMO | Admitting: Adult Health

## 2017-12-28 ENCOUNTER — Ambulatory Visit (INDEPENDENT_AMBULATORY_CARE_PROVIDER_SITE_OTHER)
Admission: RE | Admit: 2017-12-28 | Discharge: 2017-12-28 | Disposition: A | Discharge status: Home / Self Care | Payer: Medicare HMO | Source: Ambulatory Visit | Attending: Adult Health | Admitting: Adult Health

## 2017-12-28 ENCOUNTER — Encounter: Payer: Self-pay | Admitting: Adult Health

## 2017-12-28 VITALS — BP 118/88 | HR 98 | Ht 63.0 in | Wt 171.0 lb

## 2017-12-28 DIAGNOSIS — J988 Other specified respiratory disorders: Secondary | ICD-10-CM | POA: Diagnosis not present

## 2017-12-28 DIAGNOSIS — J189 Pneumonia, unspecified organism: Secondary | ICD-10-CM

## 2017-12-28 DIAGNOSIS — I482 Chronic atrial fibrillation, unspecified: Secondary | ICD-10-CM

## 2017-12-28 DIAGNOSIS — I700 Atherosclerosis of aorta: Secondary | ICD-10-CM | POA: Diagnosis not present

## 2017-12-28 DIAGNOSIS — J181 Lobar pneumonia, unspecified organism: Secondary | ICD-10-CM

## 2017-12-28 NOTE — Patient Instructions (Signed)
Continue on current regimen .  Chest xray today  CT chest in 3 months  Follow up Dr. Melvyn Novas  In 3 months and As needed

## 2017-12-28 NOTE — Assessment & Plan Note (Signed)
Cont on current regimen  follow up cards as planned.

## 2017-12-28 NOTE — Assessment & Plan Note (Signed)
RSV Bronchitis -now resolved .

## 2017-12-28 NOTE — Assessment & Plan Note (Signed)
Bilateral PNA with recent RSV Bronchitis and Sepsis -clinically improved with abx.  Check cxr today  This only showed up on CT chest so will need follow up CT chest in 3 months for total clearance.    Plan  Patient Instructions  Continue on current regimen .  Chest xray today  CT chest in 3 months  Follow up Dr. Melvyn Novas  In 3 months and As needed

## 2017-12-28 NOTE — Progress Notes (Signed)
@Patient  ID: Kathryn Chang, female    DOB: [birth year], 77 y.o.   MRN: [id number]  Chief Complaint  Patient presents with  . Follow-up    bronchitis     Referring provider: [name], MD  HPI: 77 year old female never smoker followed for upper airway cough syndrome  12/28/2017 Follow up : Post hospital: RSV bronchitis and pneumonia Patient presents for a post hospital follow-up.  Patient was admitted last month with acute respiratory failure with hypoxia likely due to RSV bronchitis and pneumonia  Along with Sepsis   She was treated with she was treated with IV antibiotics nebulized bronchodilators and oxygen.  Patient did have A. fib with RVR.  She was seen by cardiology her cardiac medications were adjusted and she required diuresis.  CT chest showed airspace consolidation along the left lower lobe and right middle lobe. CXR did not show pneumonic process.  Since discharge patient is feeling much better . Cough and congestion have resolved. Breathing is back to normal . Activity level is near baseline, just a little weaker than usual.   Patient was seen in the past for a chronic cough.  She was last seen in 2017.  Patient says she has been doing okay until this recent hospitalization  Allergies  Allergen Reactions  . Shellfish-Derived Products Anaphylaxis    Pass out and incontinent   . Tamiflu [Oseltamivir Phosphate]     Total body rash    Immunization History  Administered Date(s) Administered  . Influenza-Unspecified 07/25/2017    Past Medical History:  Diagnosis Date  . Arthritis    "hands, feet" (12/08/2017)  . Atrial fibrillation ([place])   . Atrial fibrillation with RVR ([place]) 12/07/2017  . Breast cancer, left breast ([place]) 1987  . Pneumonia    "couple times" (12/08/2017)  . SBE (subacute bacterial endocarditis) prophylaxis candidate    for MR, AI    Tobacco History: Social History   Tobacco Use  Smoking Status Never Smoker  Smokeless Tobacco Never  Used   Counseling given: Not Answered   Outpatient Encounter Medications as of 12/28/2017  Medication Sig  . acetaminophen (TYLENOL) 500 MG tablet Take 1,000 mg by mouth every 8 (eight) hours as needed for mild pain.   Marland Kitchen apixaban (ELIQUIS) 5 MG TABS tablet Take 1 tablet (5 mg total) by mouth 2 (two) times daily.  . Calcium Carbonate-Vitamin D (CALCIUM + D PO) Take 600 mg by mouth 2 (two) times daily.   . Cholecalciferol (VITAMIN D PO) Take 2,000 Units by mouth daily.    Marland Kitchen diltiazem (CARDIZEM CD) 120 MG 24 hr capsule Take 1 capsule (120 mg total) by mouth daily.  . famotidine (PEPCID) 20 MG tablet One at bedtime  . levalbuterol (XOPENEX HFA) 45 MCG/ACT inhaler Inhale 1 puff into the lungs every 4 (four) hours as needed for wheezing or shortness of breath.  . magnesium oxide (MAG-OX) 400 (241.3 Mg) MG tablet Take 1 tablet (400 mg total) by mouth daily.  . metoprolol succinate (TOPROL XL) 25 MG 24 hr tablet Take 3 tablets (75 mg total) by mouth daily.  . Multiple Vitamin (MULTIVITAMIN PO) Take 1 tablet by mouth daily.   . zoledronic acid (RECLAST) 5 MG/100ML SOLN injection Inject 5 mg into the vein See admin instructions. Yearly    No facility-administered encounter medications on file as of 12/28/2017.      Review of Systems  Constitutional:   No  weight loss, night sweats,  Fevers, chills, + fatigue, or  lassitude.  HEENT:   No headaches,  Difficulty swallowing,  Tooth/dental problems, or  Sore throat,                No sneezing, itching, ear ache, nasal congestion, post nasal drip,   CV:  No chest pain,  Orthopnea, PND, swelling in lower extremities, anasarca, dizziness, palpitations, syncope.   GI  No heartburn, indigestion, abdominal pain, nausea, vomiting, diarrhea, change in bowel habits, loss of appetite, bloody stools.   Resp: No shortness of breath with exertion or at rest.  No excess mucus, no productive cough,  No non-productive cough,  No coughing up of blood.  No change in  color of mucus.  No wheezing.  No chest wall deformity  Skin: no rash or lesions.  GU: no dysuria, change in color of urine, no urgency or frequency.  No flank pain, no hematuria   MS:  No joint pain or swelling.  No decreased range of motion.  No back pain.    Physical Exam  BP 118/88 (BP Location: Left Arm, Cuff Size: Normal)   Pulse 98   Ht 5\' 3"  (1.6 m)   Wt 171 lb (77.6 kg)   SpO2 99%   BMI 30.29 kg/m   GEN: A/Ox3; pleasant , NAD, well nourished    HEENT:  [place]/AT,  EACs-clear, TMs-wnl, NOSE-clear, THROAT-clear, no lesions, no postnasal drip or exudate noted.   NECK:  Supple w/ fair ROM; no JVD; normal carotid impulses w/o bruits; no thyromegaly or nodules palpated; no lymphadenopathy.    RESP  Clear  P & A; w/o, wheezes/ rales/ or rhonchi. no accessory muscle use, no dullness to percussion  CARD:  RRR, no m/r/g, no peripheral edema, pulses intact, no cyanosis or clubbing.  GI:   Soft & nt; nml bowel sounds; no organomegaly or masses detected.   Musco: Warm bil, no deformities or joint swelling noted.   Neuro: alert, no focal deficits noted.    Skin: Warm, no lesions or rashes    Lab Results:  CBC    Component Value Date/Time   WBC 10.2 12/12/2017 0515   RBC 4.29 12/12/2017 0515   HGB 13.0 12/12/2017 0515   HCT 38.7 12/12/2017 0515   PLT 319 12/12/2017 0515   MCV 90.2 12/12/2017 0515   MCH 30.3 12/12/2017 0515   MCHC 33.6 12/12/2017 0515   RDW 13.9 12/12/2017 0515   LYMPHSABS 2,535 08/25/2016 0952   MONOABS 650 08/25/2016 0952   EOSABS 65 08/25/2016 0952   BASOSABS 0 08/25/2016 0952    BMET    Component Value Date/Time   NA 141 12/12/2017 0515   K 4.1 12/12/2017 0515   CL 104 12/12/2017 0515   CO2 24 12/12/2017 0515   GLUCOSE 131 (H) 12/12/2017 0515   BUN 18 12/12/2017 0515   CREATININE 0.84 12/12/2017 0515   CREATININE 0.99 (H) 08/25/2016 0952   CALCIUM 8.6 (L) 12/12/2017 0515   GFRNONAA >60 12/12/2017 0515   GFRAA >60 12/12/2017 0515     BNP    Component Value Date/Time   BNP 299.7 (H) 12/10/2017 0937    ProBNP No results found for: PROBNP  Imaging: Dg Chest 2 View  Result Date: 12/07/2017 CLINICAL DATA:  Cough, flu-like symptoms EXAM: CHEST  2 VIEW COMPARISON:  12/07/2017 FINDINGS: Heart and mediastinal contours are within normal limits. No focal opacities or effusions. No acute bony abnormality. Surgical clips in the left axilla. IMPRESSION: No active cardiopulmonary disease. Electronically Signed   By: Lennette Bihari  [place] M.D.   On: 12/07/2017 18:56   Ct Chest W Contrast  Result Date: 12/10/2017 CLINICAL DATA:  Shortness of breath. EXAM: CT CHEST WITH CONTRAST TECHNIQUE: Multidetector CT imaging of the chest was performed during intravenous contrast administration. CONTRAST:  38mL ISOVUE-300 IOPAMIDOL (ISOVUE-300) INJECTION 61% COMPARISON:  CT chest 03/04/2016 FINDINGS: Cardiovascular: Moderate cardiac enlargement. No pericardial effusion. Aortic atherosclerosis identified. Mediastinum/Nodes: Normal appearance of the thyroid gland. The trachea appears patent and is midline. Small hiatal hernia. Prominent mediastinal lymph nodes without adenopathy. Lungs/Pleura: Small left pleural effusion. There is complete atelectasis of the right middle lobe. New from previous exam. Dense airspace consolidation involving the anterior and lateral left lower lobe is identified, image 105 of series 7. Mild subsegmental atelectasis within the right lower lobe is identified. Right lower lobe bronchiectasis is noted. Ground-glass attenuating structure within the anterior right apex measures 3 cm, image 28 of series 7. Smaller ground-glass attenuating nodule within the anterolateral left lower lobe measures 7 mm, image 64 of series 7. Scattered multifocal areas of clustered tree-in-bud nodularity are identified in the right lung. For example, image 76 of series 7. Upper Abdomen: Cyst in right lobe of liver measures 1.9 cm. No acute abnormality  identified. Musculoskeletal: Degenerative disc disease is identified within the lower thoracic spine. Unchanged L1 compression fracture. No suspicious bone lesions. IMPRESSION: 1. Dense airspace consolidation involving the anterior and lateral left lower lobe compatible with pneumonia and/or aspiration. Followup imaging is advised to ensure resolution and to rule out underlying malignancy. 2. Complete atelectasis of the right middle lobe. Subsegmental atelectasis in the right lower lobe also noted. Attention in these areas on follow-up imaging advised. 3. Scattered areas of clustered tree-in-bud nodularity predominantly involving the right lung identified, likely reflecting an inflammatory or infectious bronchiolitis. 4. Bilateral lower lobe bronchiectasis is likely the sequelae of recurrent, chronic inflammation. Recurrent aspiration can also have this appearance. 5. Patchy multifocal areas of ground-glass attenuation are noted. Largest in the anterior right apex. 6.  Aortic Atherosclerosis (ICD10-I70.0). Electronically Signed   By: Kerby Moors M.D.   On: 12/10/2017 19:37   Dg Chest Port 1 View  Result Date: 12/09/2017 CLINICAL DATA:  Cough and shortness of breath EXAM: PORTABLE CHEST 1 VIEW COMPARISON:  December 07, 2017 FINDINGS: There is mild left base atelectasis. Lungs elsewhere are clear. Heart is borderline enlarged with pulmonary vascularity within normal limits. No adenopathy. There are surgical clips in left axillary region. No pneumothorax. No bone lesions. IMPRESSION: Mild left base atelectasis. Lungs elsewhere clear. Stable cardiac silhouette. Electronically Signed   By: Lowella Grip III M.D.   On: 12/09/2017 07:51     Assessment & Plan:   RLL pneumonia ([place]) Bilateral PNA with recent RSV Bronchitis and Sepsis -clinically improved with abx.  Check cxr today  This only showed up on CT chest so will need follow up CT chest in 3 months for total clearance.    Plan  Patient  Instructions  Continue on current regimen .  Chest xray today  CT chest in 3 months  Follow up Dr. Melvyn Novas  In 3 months and As needed        Respiratory infection RSV Bronchitis -now resolved .   Chronic atrial fibrillation (HCC) Cont on current regimen  follow up cards as planned.      Rexene Edison, NP 12/28/2017

## 2017-12-29 ENCOUNTER — Telehealth: Payer: Self-pay | Admitting: *Deleted

## 2017-12-29 DIAGNOSIS — R310 Gross hematuria: Secondary | ICD-10-CM | POA: Insufficient documentation

## 2017-12-29 DIAGNOSIS — N200 Calculus of kidney: Secondary | ICD-10-CM | POA: Insufficient documentation

## 2017-12-29 NOTE — Progress Notes (Signed)
Chart and office note reviewed in detail  > agree with a/p as outlined    

## 2017-12-29 NOTE — Telephone Encounter (Signed)
Prior authorization for METOPROLOL SUCCINATE done through covermymeds.

## 2017-12-30 DIAGNOSIS — R828 Abnormal findings on cytological and histological examination of urine: Secondary | ICD-10-CM | POA: Diagnosis not present

## 2017-12-30 DIAGNOSIS — N200 Calculus of kidney: Secondary | ICD-10-CM | POA: Diagnosis not present

## 2017-12-30 DIAGNOSIS — Z87448 Personal history of other diseases of urinary system: Secondary | ICD-10-CM | POA: Diagnosis not present

## 2018-01-04 DIAGNOSIS — I482 Chronic atrial fibrillation: Secondary | ICD-10-CM | POA: Diagnosis not present

## 2018-01-04 DIAGNOSIS — R809 Proteinuria, unspecified: Secondary | ICD-10-CM | POA: Diagnosis not present

## 2018-01-04 DIAGNOSIS — M810 Age-related osteoporosis without current pathological fracture: Secondary | ICD-10-CM | POA: Diagnosis not present

## 2018-01-04 DIAGNOSIS — R7301 Impaired fasting glucose: Secondary | ICD-10-CM | POA: Diagnosis not present

## 2018-01-04 DIAGNOSIS — Z79899 Other long term (current) drug therapy: Secondary | ICD-10-CM | POA: Diagnosis not present

## 2018-01-04 DIAGNOSIS — E782 Mixed hyperlipidemia: Secondary | ICD-10-CM | POA: Diagnosis not present

## 2018-01-04 DIAGNOSIS — Z853 Personal history of malignant neoplasm of breast: Secondary | ICD-10-CM | POA: Diagnosis not present

## 2018-01-04 DIAGNOSIS — Z86010 Personal history of colonic polyps: Secondary | ICD-10-CM | POA: Diagnosis not present

## 2018-01-04 DIAGNOSIS — Z0000 Encounter for general adult medical examination without abnormal findings: Secondary | ICD-10-CM | POA: Diagnosis not present

## 2018-01-05 NOTE — Telephone Encounter (Signed)
Received fax from [facility] for prior approval for METOPROLOL [facility], it is approved through 12/29/2018. Will be filled by [facility] mail order pharmacy.

## 2018-01-13 ENCOUNTER — Encounter: Payer: Self-pay | Admitting: Physician Assistant

## 2018-01-25 ENCOUNTER — Ambulatory Visit: Payer: Medicare HMO | Admitting: Physician Assistant

## 2018-01-25 ENCOUNTER — Encounter: Payer: Self-pay | Admitting: Physician Assistant

## 2018-01-25 VITALS — BP 122/80 | HR 77 | Ht 63.0 in | Wt 174.4 lb

## 2018-01-25 DIAGNOSIS — I4821 Permanent atrial fibrillation: Secondary | ICD-10-CM

## 2018-01-25 DIAGNOSIS — I482 Chronic atrial fibrillation: Secondary | ICD-10-CM | POA: Diagnosis not present

## 2018-01-25 DIAGNOSIS — I700 Atherosclerosis of aorta: Secondary | ICD-10-CM | POA: Insufficient documentation

## 2018-01-25 NOTE — Progress Notes (Signed)
Cardiology Office Note:    Date:  01/25/2018   ID:  Kathryn Chang, DOB Mar 14, 1941, MRN [id number]  PCP:  [name], MD  Cardiologist:  Ena Dawley, MD   Referring MD: [name], MD   Chief Complaint  Patient presents with  . Follow-up    Atrial fibrillation    History of Present Illness:    Kathryn Chang is a 77 y.o. female with permanent atrial fibrillation on long-term anticoagulation with Apixaban, aortic insufficiency.  She was admitted in February 2019 with influenza complicated by pneumonia and sepsis as well as a atrial fibrillation with rapid ventricular rate and volume overload.  She was treated with IV Lasix.  She was last seen in follow-up by Dr. [name] 12/22/17.  Her rate was still uncontrolled and her rate controlling medication was adjusted.  Kathryn Chang returns for follow-up on atrial fibrillation.  She is here alone.  Since last seen, she has been doing well.  She denies chest pain, shortness of breath, palpitations, syncope or near syncope, edema.  She denies any significant bleeding issues.  However, she has been diagnosed with hematuria and has seen urology.  She has been told that she has a kidney stone.  She has follow-up with urology soon to discuss treatment options.  Prior CV studies:   The following studies were reviewed today:  Echo 12/09/17 Diffuse HK worse in the mid and basal inferior wall, EF 50-55, mild to moderate AI, mild MR, moderate LAE, moderate RAE, PASP 47  Echo 03/02/17 EF 55-60, normal wall motion, mild AI, ascending aorta 38 (mildly dilated), mild MR, severe LAE, mild RAE, mild TR, PASP 32  Echo 01/01/15 EF 55-60, normal wall motion, moderate AI, severe LAE, moderate RAE, PASP 42  Past Medical History:  Diagnosis Date  . Aortic atherosclerosis ([place])    CT 11/2017  . Arthritis    "hands, feet" (12/08/2017)  . Breast cancer, left breast ([place]) 1987  . Permanent atrial fibrillation ([place])   . Pneumonia    "couple  times" (12/08/2017)  . SBE (subacute bacterial endocarditis) prophylaxis candidate    for MR, AI   Surgical Hx: The patient  has a past surgical history that includes US ECHOCARDIOGRAPHY (10/2010); Appendectomy; Mastectomy (Left, 1987); Breast biopsy (Left, 1987); and Tubal ligation.   Current Medications: Current Meds  Medication Sig  . acetaminophen (TYLENOL) 500 MG tablet Take 1,000 mg by mouth every 8 (eight) hours as needed for mild pain.   Marland Kitchen apixaban (ELIQUIS) 5 MG TABS tablet Take 1 tablet (5 mg total) by mouth 2 (two) times daily.  . Cholecalciferol (VITAMIN D PO) Take 2,000 Units by mouth daily.    Marland Kitchen diltiazem (CARDIZEM CD) 120 MG 24 hr capsule Take 1 capsule (120 mg total) by mouth daily.  . famotidine (PEPCID) 20 MG tablet One at bedtime  . levalbuterol (XOPENEX HFA) 45 MCG/ACT inhaler Inhale 1 puff into the lungs every 4 (four) hours as needed for wheezing or shortness of breath.  . metoprolol succinate (TOPROL XL) 25 MG 24 hr tablet Take 3 tablets (75 mg total) by mouth daily.  . Multiple Vitamin (MULTIVITAMIN PO) Take 1 tablet by mouth daily.   . zoledronic acid (RECLAST) 5 MG/100ML SOLN injection Inject 5 mg into the vein See admin instructions. Yearly      Allergies:   Shellfish-derived products and Tamiflu [oseltamivir phosphate]   Social History   Tobacco Use  . Smoking status: Never Smoker  . Smokeless tobacco: Never Used  Substance Use Topics  . Alcohol use: Yes    Alcohol/week: 1.2 oz    Types: 2 Glasses of wine per week  . Drug use: No     Family Hx: The patient's family history includes Heart disease in her father and mother; Hypertension in her father and mother.  ROS:   Please see the history of present illness.    ROS All other systems reviewed and are negative.   EKGs/Labs/Other Test Reviewed:    EKG:  EKG is  ordered today.  The ekg ordered today demonstrates atrial fibrillation, HR 77  Recent Labs: 12/08/2017: ALT 26; Magnesium 1.8; TSH  0.612 12/10/2017: B Natriuretic Peptide 299.7 12/12/2017: BUN 18; Creatinine, Ser 0.84; Hemoglobin 13.0; Platelets 319; Potassium 4.1; Sodium 141   Recent Lipid Panel No results found for: CHOL, TRIG, HDL, CHOLHDL, LDLCALC, LDLDIRECT  Physical Exam:    VS:  BP 122/80   Pulse 77   Ht 5\' 3"  (1.6 m)   Wt 174 lb 6.4 oz (79.1 kg)   SpO2 96%   BMI 30.89 kg/m     Wt Readings from Last 3 Encounters:  01/25/18 174 lb 6.4 oz (79.1 kg)  12/28/17 171 lb (77.6 kg)  12/22/17 168 lb (76.2 kg)     Physical Exam  Constitutional: She is oriented to person, place, and time. She appears well-developed and well-nourished. No distress.  HENT:  Head: Normocephalic and atraumatic.  Neck: No JVD present.  Cardiovascular: Normal rate. An irregularly irregular rhythm present.  No murmur heard. Pulmonary/Chest: Effort normal. She has no rales.  Abdominal: Soft.  Musculoskeletal: She exhibits no edema.  Neurological: She is alert and oriented to person, place, and time.  Skin: Skin is warm and dry.    ASSESSMENT & PLAN:    #1.  Permanent atrial fibrillation (HCC)  Rate is well controlled on current medical regimen.  Continue current dose of diltiazem and metoprolol succinate.  Continue current dose of Apixaban.  CHADS2-VASc=2 (female, age x 1).  However, she will be 67 in October.  She will meet criteria for long-term anticoagulation at that point by the new guidelines.  Therefore, I think she should remain on long-term anticoagulation.  She can likely hold her Apixaban for 2-3 days for any urologic procedure that is needed.   Dispo:  Return in about 6 months (around 07/27/2018) for Routine Follow Up w/ Dr. [name].   Medication Adjustments/Labs and Tests Ordered: Current medicines are reviewed at length with the patient today.  Concerns regarding medicines are outlined above.  Tests Ordered: Orders Placed This Encounter  Procedures  . EKG 12-Lead   Medication Changes: No orders of the defined  types were placed in this encounter.   Signed, Richardson Dopp, PA-C  01/25/2018 11:00 AM    [facility] [address], [place], [place]  [address] Phone: [phone]; Fax: [phone]

## 2018-01-25 NOTE — Patient Instructions (Signed)
Medication Instructions:  1. Your physician recommends that you continue on your current medications as directed. Please refer to the Current Medication list given to you today.   Labwork: NONE ORDERED TODAY  Testing/Procedures: NONE ORDERED TODAY  Follow-Up: Your physician wants you to follow-up in: 6 MONTHS WITH DR. Johann Capers will receive a reminder letter in the mail two months in advance. If you don't receive a letter, please call our office to schedule the follow-up appointment.   Any Other Special Instructions Will Be Listed Below (If Applicable).     If you need a refill on your cardiac medications before your next appointment, please call your pharmacy.

## 2018-02-06 DIAGNOSIS — R310 Gross hematuria: Secondary | ICD-10-CM | POA: Diagnosis not present

## 2018-02-06 DIAGNOSIS — Z87448 Personal history of other diseases of urinary system: Secondary | ICD-10-CM | POA: Diagnosis not present

## 2018-02-06 DIAGNOSIS — N200 Calculus of kidney: Secondary | ICD-10-CM | POA: Diagnosis not present

## 2018-02-15 DIAGNOSIS — N200 Calculus of kidney: Secondary | ICD-10-CM | POA: Diagnosis not present

## 2018-02-15 DIAGNOSIS — I499 Cardiac arrhythmia, unspecified: Secondary | ICD-10-CM | POA: Diagnosis not present

## 2018-02-15 DIAGNOSIS — Z7901 Long term (current) use of anticoagulants: Secondary | ICD-10-CM | POA: Diagnosis not present

## 2018-03-13 ENCOUNTER — Other Ambulatory Visit: Payer: Self-pay

## 2018-03-13 ENCOUNTER — Ambulatory Visit: Payer: Medicare HMO | Admitting: Podiatry

## 2018-03-13 ENCOUNTER — Ambulatory Visit (INDEPENDENT_AMBULATORY_CARE_PROVIDER_SITE_OTHER)
Admission: RE | Admit: 2018-03-13 | Discharge: 2018-03-13 | Disposition: A | Discharge status: Home / Self Care | Payer: Medicare HMO | Source: Ambulatory Visit | Attending: Adult Health | Admitting: Adult Health

## 2018-03-13 ENCOUNTER — Encounter: Payer: Self-pay | Admitting: Podiatry

## 2018-03-13 DIAGNOSIS — M779 Enthesopathy, unspecified: Secondary | ICD-10-CM

## 2018-03-13 DIAGNOSIS — L600 Ingrowing nail: Secondary | ICD-10-CM | POA: Diagnosis not present

## 2018-03-13 DIAGNOSIS — J181 Lobar pneumonia, unspecified organism: Secondary | ICD-10-CM | POA: Diagnosis not present

## 2018-03-13 DIAGNOSIS — N200 Calculus of kidney: Secondary | ICD-10-CM | POA: Diagnosis not present

## 2018-03-13 DIAGNOSIS — J189 Pneumonia, unspecified organism: Secondary | ICD-10-CM

## 2018-03-13 MED ORDER — TRIAMCINOLONE ACETONIDE 10 MG/ML IJ SUSP
10.0000 mg | Freq: Once | INTRAMUSCULAR | Status: AC
  Administered 2018-03-13: 10 mg

## 2018-03-15 NOTE — Progress Notes (Signed)
Subjective:   Patient ID: Kathryn Chang, female   DOB: 77 y.o.   MRN: [id number]   HPI Patient presents stating that the dorsal lateral aspect of both feet are sore in the area we did the previous injection is somewhat better but it has migrated more lateral but is still very tender when palpated   ROS      Objective:  Physical Exam  Dorsal tendinitis bilateral with inflammation fluid buildup     Assessment:  Inflammatory tendinitis bilateral with probable moderate arthritic changes     Plan:  Reviewed condition injected the dorsal tendon complex bilateral 3 mg Kenalog 5 mg Xylocaine advised on heat ice therapy and reappoint as symptoms indicate

## 2018-03-16 ENCOUNTER — Other Ambulatory Visit: Payer: Self-pay | Admitting: Cardiology

## 2018-03-16 DIAGNOSIS — I5043 Acute on chronic combined systolic (congestive) and diastolic (congestive) heart failure: Secondary | ICD-10-CM

## 2018-03-16 DIAGNOSIS — I4891 Unspecified atrial fibrillation: Secondary | ICD-10-CM

## 2018-03-30 ENCOUNTER — Encounter: Payer: Self-pay | Admitting: Internal Medicine

## 2018-03-30 ENCOUNTER — Ambulatory Visit: Payer: Medicare HMO | Admitting: Internal Medicine

## 2018-03-30 VITALS — BP 132/78 | HR 89 | Ht 63.0 in | Wt 173.6 lb

## 2018-03-30 DIAGNOSIS — R058 Other specified cough: Secondary | ICD-10-CM

## 2018-03-30 DIAGNOSIS — R9389 Abnormal findings on diagnostic imaging of other specified body structures: Secondary | ICD-10-CM | POA: Diagnosis not present

## 2018-03-30 DIAGNOSIS — R05 Cough: Secondary | ICD-10-CM

## 2018-03-30 NOTE — Patient Instructions (Signed)
Ok to try off pepcid at bedtime to see if it makes any difference with voice or cough   If you are satisfied with your treatment plan,  let your doctor know and he/she can either refill your medications or you can return here when your prescription runs out.     If in any way you are not 100% satisfied,  please tell us.  If 100% better, tell your friends!  Pulmonary follow up is as needed

## 2018-03-30 NOTE — Progress Notes (Signed)
Subjective:    Patient ID: Kathryn Chang, female    DOB: 11-Aug-1941    MRN: [id number]  Brief patient profile:  67 yowf never smoker never previous resp symptoms new onset cough /wheeze with URI Aug 2016 req pred/abx recurred in May 2017 and that 2 courses of prednisone and raspy throat sensation / clearing throat did not improve and can't sing in choir with abn cxr 03/17/16 so referred to pulmonary clinic 05/04/2016 by Dr [name]   History of Present Illness  05/04/2016 1st [facility] office visit/ Ataya Murdy   Chief Complaint  Patient presents with  . Pulmonary Consult    Referred by Dr. [name] for cough. Pt denies a cough, but states she is here for eval of abn cxr done May 2017. She denies any respiratory co's today.   other than ongoing issues related to raspy voice / throat clearing doing fine/ does fine sleeping and no activity limitation/ does have pnds with some watery nasal discharge x one year. No resp to otcs/ maint rx with fosfamax and fish oil  rec Try off fosfamax and fish oil until next visit Pantoprazole (protonix) 40 mg   Take  30-60 min before first meal of the day and Pepcid (famotidine)  20 mg one @  bedtime until return to office - this is the best way to tell whether stomach acid is contributing to your problem.  GERD  Diet      06/15/2016  f/u ov/Denisha Hoel re: uacs assoc with rhinitis with Pos RAST to cat> dog  Chief Complaint  Patient presents with  . Follow-up    Pt states doing well and denies any cough. No new co's today.   main issue nose runs intermittent/watery / ? Better on cruise not even aware of when x never bothers sleep Able to sing now, Not limited by breathing from desired activities   rec Stop protonix and just use pepcid after bfast daily x month and stop If drainage gets worse try zyrtec 10 mg - take at bedtime if makes you sleepy  Discuss the fosfamax with your doctor who may  give you the alternative =Reclast yearly > started  reclast      03/30/2018  f/u ov/Raelene Trew re: f/u uacs/ fx sternum May 2017  Chief Complaint  Patient presents with  . Follow-up    Breathing has returned to her normal baseline. She has not had to use her xopenex.    Dyspnea:  Not limited by breathing from desired activities   Cough: none    No obvious day to day or daytime variability or assoc excess/ purulent sputum or mucus plugs or hemoptysis or cp or chest tightness, subjective wheeze or overt sinus or hb symptoms. No unusual exposure hx or h/o childhood pna/ asthma or knowledge of premature birth.  Sleeping fine  without nocturnal  or early am exacerbation  of respiratory  c/o's or need for noct saba. Also denies any obvious fluctuation of symptoms with weather or environmental changes or other aggravating or alleviating factors except as outlined above   Current Allergies, Complete Past Medical History, Past Surgical History, Family History, and Social History were reviewed in [employer] record.  ROS  The following are not active complaints unless bolded Hoarseness, sore throat, dysphagia, dental problems, itching, sneezing,  nasal congestion or discharge of excess mucus or purulent secretions, ear ache,   fever, chills, sweats, unintended wt loss or wt gain, classically pleuritic or exertional cp,  orthopnea pnd or arm/hand swelling  or leg swelling, presyncope, palpitations, abdominal pain, anorexia, nausea, vomiting, diarrhea  or change in bowel habits or change in bladder habits, change in stools or change in urine, dysuria, hematuria,  rash, arthralgias, visual complaints, headache, numbness, weakness or ataxia or problems with walking or coordination,  change in mood or  memory.        Current Meds  Medication Sig  . acetaminophen (TYLENOL) 500 MG tablet Take 1,000 mg by mouth every 8 (eight) hours as needed for mild pain.   Marland Kitchen apixaban (ELIQUIS) 5 MG TABS tablet Take 1 tablet (5 mg total) by mouth 2 (two)  times daily.  Marland Kitchen diltiazem (CARDIZEM CD) 120 MG 24 hr capsule Take 1 capsule (120 mg total) by mouth daily.  . famotidine (PEPCID) 20 MG tablet One at bedtime  . levalbuterol (XOPENEX HFA) 45 MCG/ACT inhaler Inhale 1 puff into the lungs every 4 (four) hours as needed for wheezing or shortness of breath.  . metoprolol succinate (TOPROL-XL) 25 MG 24 hr tablet TAKE 3 TABLETS EVERY DAY  . zoledronic acid (RECLAST) 5 MG/100ML SOLN injection Inject 5 mg into the vein See admin instructions. Yearly                          Objective:   Physical Exam  Healthy appearing amb wf nad      03/30/2018          173  06/15/2016        167       03/04/16 163 lb (73.936 kg)  03/03/16 163 lb (73.936 kg)    Vital signs reviewed - Note on arrival 02 sats  96% on RA          HEENT: nl dentition, turbinates bilaterally, and oropharynx. Nl external ear canals without cough reflex   NECK :  without JVD/Nodes/TM/ nl carotid upstrokes bilaterally   LUNGS: no acc muscle use,  Nl contour chest which is clear to A and P bilaterally without cough on insp or exp maneuvers   CV:  RRR  no s3 or murmur or increase in P2, and no edema   ABD:  soft and nontender with nl inspiratory excursion in the supine position. No bruits or organomegaly appreciated, bowel sounds nl  MS:  Nl gait/ ext warm without deformities, calf tenderness, cyanosis or clubbing No obvious joint restrictions   SKIN: warm and dry without lesions    NEURO:  alert, approp, nl sensorium with  no motor or cerebellar deficits apparent.        I personally reviewed images and agree with radiology impression as follows:   Chest CT w/o contrast 03/13/18  1. Interval development of linear band of sclerosis through the upper sternum. This may represent healing fracture although metastatic disease cannot be excluded. 2. Near complete interval resolution of the scattered areas of patchy airspace disease, collapse/consolidation,  tree-in-bud nodularity, and ground-glass opacity seen on the previous study. Only scattered areas of residual linear subsegmental atelectasis or scarring are noted on today's study.    Assessment & Plan:

## 2018-03-30 NOTE — Assessment & Plan Note (Signed)
Ct chest 03/04/16 nl s emphysema CXR  05/04/2016 nl x mild hyperinflation  - CT chest 03/13/18 ok x for sternal sclerotic lesion s/p ant cw injury Mar 04 2016   Extensive review of imagig from May 2017 thru latest study 03/13/18  1) no parenchymal lesions of concern  2) the sclerotic area corresponds to her injury in May 2017 and the lack of an obvious sternal fx at the time is not surprising as easily could have been missed on Chest CT then and does not need further w/u in absence of recurrent pain    3) no directed f/u needed    Discussed in detail all the  indications, usual  risks and alternatives  relative to the benefits with patient who agrees to proceed with conservative f/u as outlined  (prn)   20 min or 25 min appt spent in counseling/review of studies with pt

## 2018-03-30 NOTE — Assessment & Plan Note (Signed)
FENO 05/04/2016  =   25 - Allergy profile 05/04/2016 >  Eos 0.1 /  IgE  146 Pos RAST Cat >> dog  - trial off fosfamax and on gerd rx x 6 weeks > 06/15/2016  Resolved  - 03/30/2018 ok to try off pepcid    At this point off fosfamax should be able to d/c the pepcid and if flares off pepcid consider GI eval at discretion of pcp   Pulmonary f/u can be prn

## 2018-04-05 ENCOUNTER — Telehealth: Payer: Self-pay

## 2018-04-05 MED ORDER — DILTIAZEM HCL ER COATED BEADS 120 MG PO CP24: 120.0000 mg | ORAL_CAPSULE | Freq: Every day | ORAL | 3 refills | Status: DC

## 2018-04-05 NOTE — Telephone Encounter (Signed)
Pt was simply dropping off a form to request a 90 day supply for her Cardizem CD 120 mg to be sent to her mail delivery, [employer].  Sent this prescription into [facility], for a 90 day supply, as requested by the pt.  Pt verbalized understanding and gracious for all the assistance provided.

## 2018-05-11 ENCOUNTER — Other Ambulatory Visit: Payer: Self-pay | Admitting: Cardiology

## 2018-06-22 DIAGNOSIS — L814 Other melanin hyperpigmentation: Secondary | ICD-10-CM | POA: Diagnosis not present

## 2018-06-22 DIAGNOSIS — L570 Actinic keratosis: Secondary | ICD-10-CM | POA: Diagnosis not present

## 2018-06-22 DIAGNOSIS — D485 Neoplasm of uncertain behavior of skin: Secondary | ICD-10-CM | POA: Diagnosis not present

## 2018-06-22 DIAGNOSIS — L821 Other seborrheic keratosis: Secondary | ICD-10-CM | POA: Diagnosis not present

## 2018-06-22 DIAGNOSIS — D2261 Melanocytic nevi of right upper limb, including shoulder: Secondary | ICD-10-CM | POA: Diagnosis not present

## 2018-07-18 DIAGNOSIS — Z23 Encounter for immunization: Secondary | ICD-10-CM | POA: Diagnosis not present

## 2018-08-01 DIAGNOSIS — H25813 Combined forms of age-related cataract, bilateral: Secondary | ICD-10-CM | POA: Diagnosis not present

## 2018-08-01 DIAGNOSIS — H353131 Nonexudative age-related macular degeneration, bilateral, early dry stage: Secondary | ICD-10-CM | POA: Diagnosis not present

## 2018-09-25 DIAGNOSIS — Z79899 Other long term (current) drug therapy: Secondary | ICD-10-CM | POA: Diagnosis not present

## 2018-09-25 DIAGNOSIS — N200 Calculus of kidney: Secondary | ICD-10-CM | POA: Diagnosis not present

## 2018-10-05 ENCOUNTER — Encounter (HOSPITAL_COMMUNITY): Payer: Self-pay

## 2018-10-05 ENCOUNTER — Ambulatory Visit (HOSPITAL_COMMUNITY)
Admission: RE | Admit: 2018-10-05 | Discharge: 2018-10-05 | Disposition: A | Discharge status: Home / Self Care | Payer: Medicare HMO | Source: Ambulatory Visit | Attending: Family Medicine | Admitting: Family Medicine

## 2018-10-05 DIAGNOSIS — M810 Age-related osteoporosis without current pathological fracture: Secondary | ICD-10-CM | POA: Insufficient documentation

## 2018-10-05 MED ORDER — SODIUM CHLORIDE 0.9 % IV SOLN
INTRAVENOUS | Status: DC
  Administered 2018-10-05: 13:00:00 via INTRAVENOUS

## 2018-10-05 MED ORDER — ZOLEDRONIC ACID 5 MG/100ML IV SOLN
5.0000 mg | Freq: Once | INTRAVENOUS | Status: AC
  Administered 2018-10-05: 5 mg via INTRAVENOUS
  Filled 2018-10-05: qty 100

## 2018-10-05 NOTE — Discharge Instructions (Signed)

## 2018-10-11 DIAGNOSIS — Z1231 Encounter for screening mammogram for malignant neoplasm of breast: Secondary | ICD-10-CM | POA: Diagnosis not present

## 2018-11-03 IMAGING — DX DG CHEST 2V
2 series · 2 of 2 positions shown · non-contrast
Comparison: CT chest 12/10/2017

CLINICAL DATA: Follow-up pneumonia.  No complaints.

EXAM:
CHEST - 2 VIEW

[chest pa]
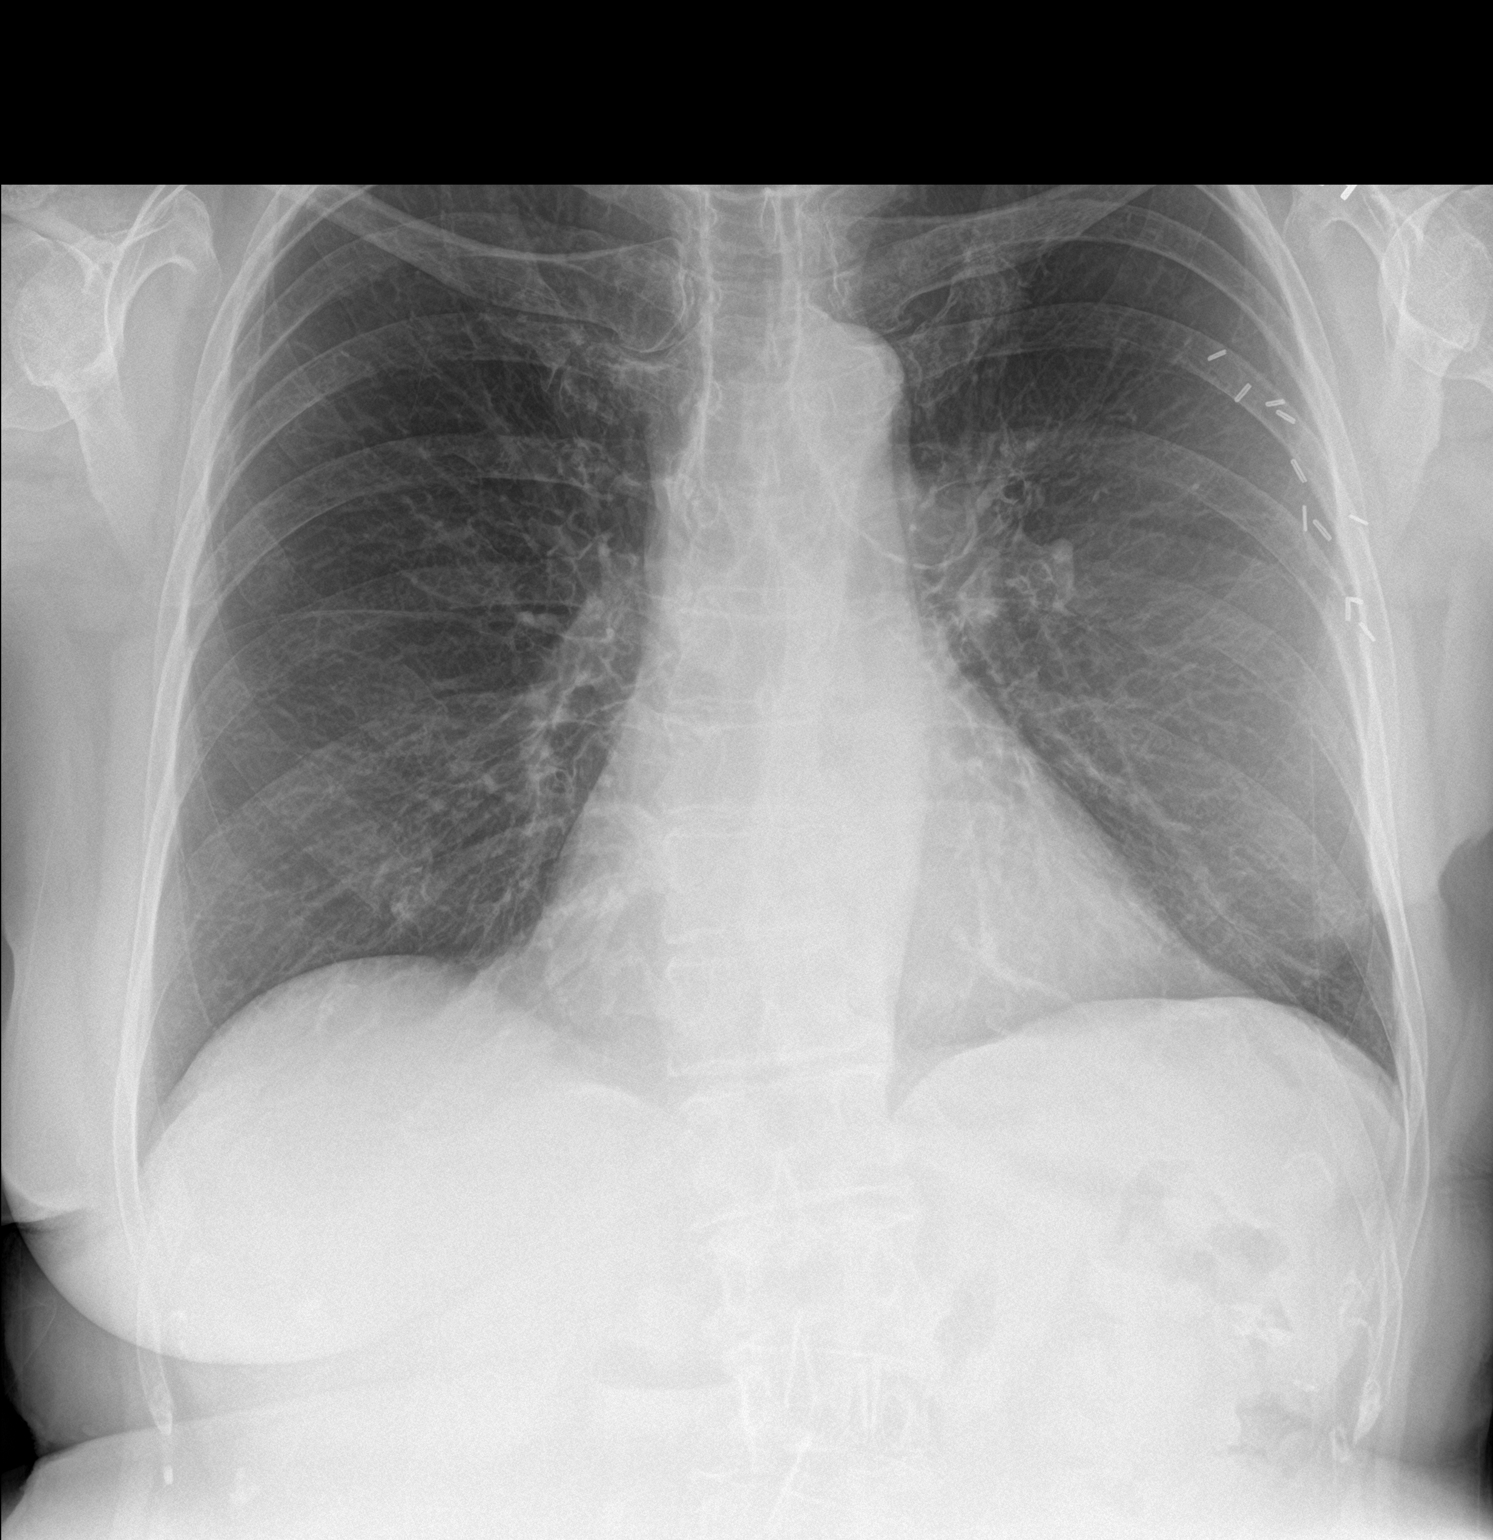

[chest lat]
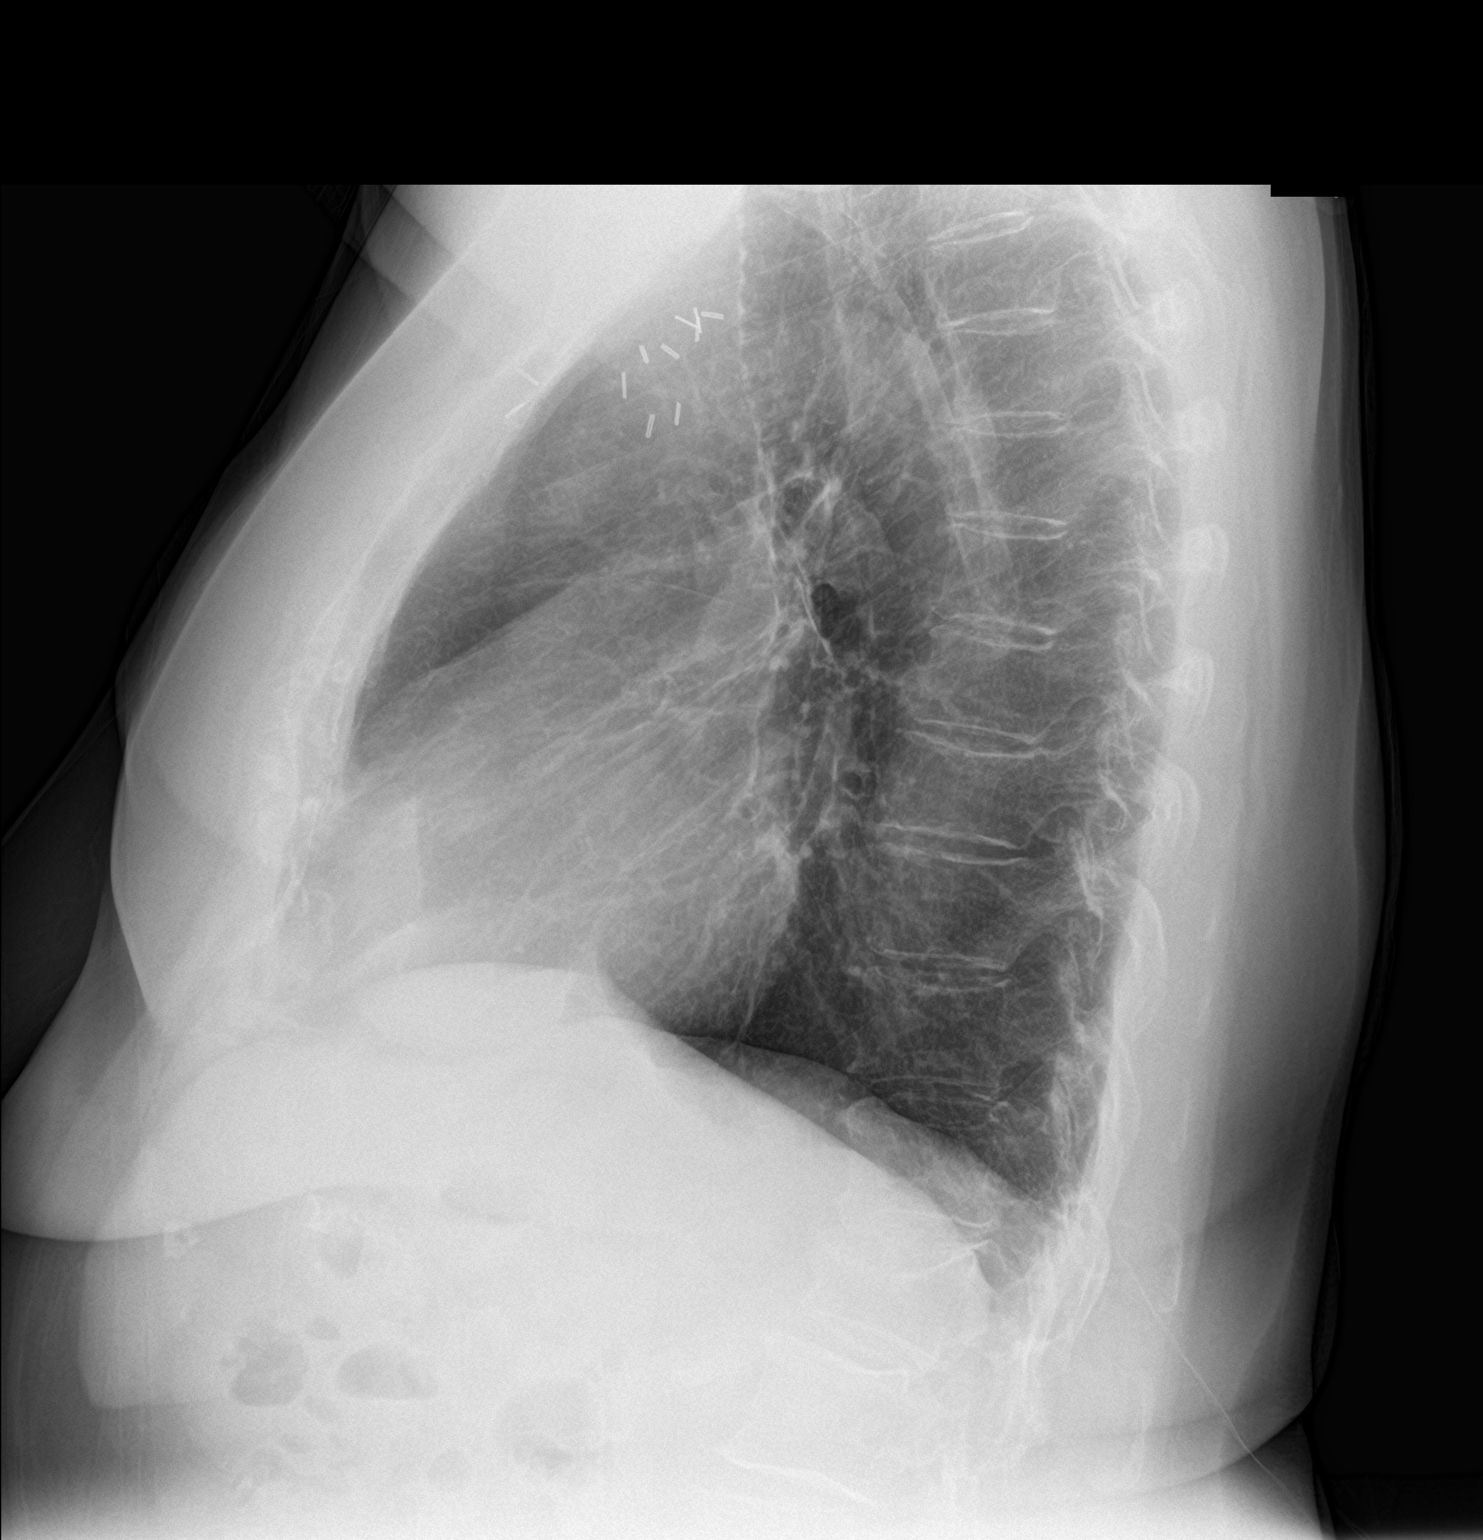

[2 of 2 positions shown; findings below may reference images not displayed]

FINDINGS: There is no focal parenchymal opacity. There is no pleural effusion
or pneumothorax. The heart and mediastinal contours are
unremarkable.

There are surgical clips in the left axilla from prior axillary
dissection.

The osseous structures are unremarkable.
IMPRESSION: No active cardiopulmonary disease.

## 2018-11-08 ENCOUNTER — Other Ambulatory Visit: Payer: Self-pay | Admitting: Cardiology

## 2018-11-09 NOTE — Telephone Encounter (Signed)
Pt last saw Richardson Dopp, PA on 01/25/18, last labs 12/12/17 Creat 0.84, age 78, weight 78.7kg, based on specified criteria pt is on appropriate dosage of Eliquis 5mg  BID.  Will refill rx.

## 2019-01-30 ENCOUNTER — Telehealth: Payer: Self-pay

## 2019-01-30 NOTE — Telephone Encounter (Signed)
I have done a Metoprolol Succ PA (Quantity exception) through covermymeds. Key: [id number]  I did receive the following message from Covermymeds: Rachna Merlo Key: [id number] - PA Case ID: [id number] - Rx #: [id number]     Outcome  Approvedtoday PA Case: [id number], Status: Approved, Coverage Starts on: 10/25/2018 12:00:00 AM, Coverage Ends on: 10/25/2019 12:00:00 AM. Questions? Contact [phone].  I have notified the pts pharmacy of this approval.

## 2019-02-14 ENCOUNTER — Other Ambulatory Visit: Payer: Self-pay | Admitting: Cardiology

## 2019-03-26 DIAGNOSIS — N200 Calculus of kidney: Secondary | ICD-10-CM | POA: Diagnosis not present

## 2019-04-04 DIAGNOSIS — N200 Calculus of kidney: Secondary | ICD-10-CM | POA: Diagnosis not present

## 2019-04-08 ENCOUNTER — Other Ambulatory Visit: Payer: Self-pay | Admitting: Cardiology

## 2019-04-09 NOTE — Telephone Encounter (Signed)
Prescription refill request for Eliquis received.  Last office visit: Richardson Dopp (01-25-2018) Over due for appointment. Called pt to have her schedule an appointment. Appointment scheduled with Dr. [name] 05-10-2019 Scr: 0.82 (09-25-2018 via [place]) Age: 78 Weight: 79.1 kg per Trinidad Curet note (01/25/2018)  Prescription refill request sent.

## 2019-05-03 ENCOUNTER — Encounter: Payer: Self-pay | Admitting: *Deleted

## 2019-05-03 ENCOUNTER — Telehealth: Payer: Self-pay | Admitting: *Deleted

## 2019-05-03 NOTE — Telephone Encounter (Signed)
Virtual Visit Pre-Appointment Phone Call  "(Name), I am calling you today to discuss your upcoming appointment. We are currently trying to limit exposure to the virus that causes COVID-19 by seeing patients at home rather than in the office."  "What is the BEST phone number to call the day of the visit?" - include this in appointment notes-YES UPDATED 1. Do you have or have access to (through a family member/friend) a smartphone with video capability that we can use for your visit?" a. If yes - list this number in appt notes as cell (if different from BEST phone #) and list the appointment type as a VIDEO visit in appointment notes-UPDATED   2. Confirm consent - "In the setting of the current Covid19 crisis, you are scheduled for a ( video) visit with Dr. [name] on 7/16 at 0830.  Just as we do with many in-office visits, in order for you to participate in this visit, we must obtain consent.  If you'd like, I can send this to your mychart (if signed up) or email for you to review.  Otherwise, I can obtain your verbal consent now.  All virtual visits are billed to your insurance company just like a normal visit would be.  By agreeing to a virtual visit, we'd like you to understand that the technology does not allow for your provider to perform an examination, and thus may limit your provider's ability to fully assess your condition. If your provider identifies any concerns that need to be evaluated in person, we will make arrangements to do so.  Finally, though the technology is pretty good, we cannot assure that it will always work on either your or our end, and in the setting of a video visit, we may have to convert it to a phone-only visit.  In either situation, we cannot ensure that we have a secure connection.  Are you willing to proceed?" STAFF: Did the patient verbally acknowledge consent to telehealth visit? Document YES/NO here: PT GAVE VERBAL CONSENT AS WELL AS SENT CONSENT TO TREAT TO HER  MYCHART.  3. Advise patient to be prepared - "Two hours prior to your appointment, go ahead and check your blood pressure, pulse, oxygen saturation, and your weight (if you have the equipment to check those) and write them all down. When your visit starts, your provider will ask you for this information. If you have an Apple Watch or Kardia device, please plan to have heart rate information ready on the day of your appointment. Please have a pen and paper handy nearby the day of the visit as well."-YES PT WILL TAKE  4. Give patient instructions for  Doxy.me -YES AWARE  5. Inform patient they will receive a phone call 15 minutes prior to their appointment time (may be from unknown caller ID) so they should be prepared to [place] has been deemed a candidate for a follow-up tele-health visit to limit community exposure during the Covid-19 pandemic. I spoke with the patient via phone to ensure availability of phone/video source, confirm preferred email & phone number, and discuss instructions and expectations.  I reminded Eleora L Perine to be prepared with any vital sign and/or heart rhythm information that could potentially be obtained via home monitoring, at the time of her visit. I reminded Marikay Roads Rosales to expect a phone call prior to her visit.  Nuala Alpha, LPN 1/0/6269 4:85 AM  IF USING  DOXY.ME - The patient will receive a link just prior to their visit by text.-PT AWARE     FULL LENGTH CONSENT FOR TELE-HEALTH VISIT   I hereby voluntarily request, consent and authorize [facility] and its employed or contracted physicians, physician assistants, nurse practitioners or other licensed health care professionals (the Practitioner), to provide me with telemedicine health care services (the Services") as deemed necessary by the treating Practitioner. I acknowledge and consent to receive the Services by the  Practitioner via telemedicine. I understand that the telemedicine visit will involve communicating with the Practitioner through live audiovisual communication technology and the disclosure of certain medical information by electronic transmission. I acknowledge that I have been given the opportunity to request an in-person assessment or other available alternative prior to the telemedicine visit and am voluntarily participating in the telemedicine visit.  I understand that I have the right to withhold or withdraw my consent to the use of telemedicine in the course of my care at any time, without affecting my right to future care or treatment, and that the Practitioner or I may terminate the telemedicine visit at any time. I understand that I have the right to inspect all information obtained and/or recorded in the course of the telemedicine visit and may receive copies of available information for a reasonable fee.  I understand that some of the potential risks of receiving the Services via telemedicine include:   Delay or interruption in medical evaluation due to technological equipment failure or disruption;  Information transmitted may not be sufficient (e.g. poor resolution of images) to allow for appropriate medical decision making by the Practitioner; and/or   In rare instances, security protocols could fail, causing a breach of personal health information.  Furthermore, I acknowledge that it is my responsibility to provide information about my medical history, conditions and care that is complete and accurate to the best of my ability. I acknowledge that Practitioner's advice, recommendations, and/or decision may be based on factors not within their control, such as incomplete or inaccurate data provided by me or distortions of diagnostic images or specimens that may result from electronic transmissions. I understand that the practice of medicine is not an exact science and that Practitioner makes  no warranties or guarantees regarding treatment outcomes. I acknowledge that I will receive a copy of this consent concurrently upon execution via email to the email address I last provided but may also request a printed copy by calling the office of [facility].    I understand that my insurance will be billed for this visit.   I have read or had this consent read to me.  I understand the contents of this consent, which adequately explains the benefits and risks of the Services being provided via telemedicine.   I have been provided ample opportunity to ask questions regarding this consent and the Services and have had my questions answered to my satisfaction.  I give my informed consent for the services to be provided through the use of telemedicine in my medical care  By participating in this telemedicine visit I agree to the above.-PT GAVE VERBAL CONSENT AS WELL AS CONSENT TO TREAT IS IN HER MYCHART TO REVIEW.

## 2019-05-10 ENCOUNTER — Encounter: Payer: Self-pay | Admitting: *Deleted

## 2019-05-10 ENCOUNTER — Encounter: Payer: Self-pay | Admitting: Cardiology

## 2019-05-10 ENCOUNTER — Telehealth (INDEPENDENT_AMBULATORY_CARE_PROVIDER_SITE_OTHER): Payer: Medicare HMO | Admitting: Cardiology

## 2019-05-10 ENCOUNTER — Other Ambulatory Visit: Payer: Self-pay

## 2019-05-10 DIAGNOSIS — Z7901 Long term (current) use of anticoagulants: Secondary | ICD-10-CM | POA: Diagnosis not present

## 2019-05-10 DIAGNOSIS — I351 Nonrheumatic aortic (valve) insufficiency: Secondary | ICD-10-CM | POA: Diagnosis not present

## 2019-05-10 DIAGNOSIS — I119 Hypertensive heart disease without heart failure: Secondary | ICD-10-CM | POA: Diagnosis not present

## 2019-05-10 DIAGNOSIS — I482 Chronic atrial fibrillation, unspecified: Secondary | ICD-10-CM | POA: Diagnosis not present

## 2019-05-10 DIAGNOSIS — I5032 Chronic diastolic (congestive) heart failure: Secondary | ICD-10-CM | POA: Diagnosis not present

## 2019-05-10 NOTE — Patient Instructions (Signed)
Medication Instructions:   Your physician recommends that you continue on your current medications as directed. Please refer to the Current Medication list given to you today.   If you need a refill on your cardiac medications before your next appointment, please call your pharmacy.     Follow-Up: At [facility], you and your health needs are our priority.  As part of our continuing mission to provide you with exceptional heart care, we have created designated Provider Care Teams.  These Care Teams include your primary Cardiologist (physician) and Advanced Practice Providers (APPs -  Physician Assistants and Nurse Practitioners) who all work together to provide you with the care you need, when you need it.  Your physician wants you to follow-up in: [place] will receive a reminder letter in the mail two months in advance. If you don't receive a letter, please call our office to schedule the follow-up appointment.

## 2019-05-10 NOTE — Progress Notes (Signed)
Virtual Visit via Video Note   This visit type was conducted due to national recommendations for restrictions regarding the COVID-19 Pandemic (e.g. social distancing) in an effort to limit this patient's exposure and mitigate transmission in our community.  Due to her co-morbid illnesses, this patient is at least at moderate risk for complications without adequate follow up.  This format is felt to be most appropriate for this patient at this time.  All issues noted in this document were discussed and addressed.  A limited physical exam was performed with this format.  Please refer to the patient's chart for her consent to telehealth for [facility].   Date:  05/10/2019   ID:  Kathryn Chang, DOB 1940/12/06, MRN [id number]  Patient Location: Home Provider Location: Home  PCP:  [name], MD  Cardiologist:  Ena Dawley, MD  Electrophysiologist:  None   Evaluation Performed:  Follow-Up Visit  Chief Complaint: 1 year follow-up  History of Present Illness:    Kathryn Chang is a 78 y.o. female with permanent atrial fibrillation on long-term anticoagulation with Apixaban, aortic insufficiency.  She was admitted in February 2019 with influenza complicated by pneumonia and sepsis as well as a atrial fibrillation with rapid ventricular rate and volume overload.  She was treated with IV Lasix.  She was last seen in follow-up by Dr. [name] 12/22/17.  Her rate was still uncontrolled and her rate controlling medication was adjusted.  05/10/2019 -this is 1 year follow-up, the patient is doing well, she has been compliant with her medications and has no side effects, no bleeding, she is scheduled to have blood work done at her primary care office on May 25, 2019, they will fax it to our office.  She denies any lower extremity edema orthopnea proximal nocturnal dyspnea.  She has intermittent exercise program and diet program and has lost 20 pounds in the last year and overall  feels significantly better.   Prior CV studies:   The following studies were reviewed today:  Echo 12/09/17 Diffuse HK worse in the mid and basal inferior wall, EF 50-55, mild to moderate AI, mild MR, moderate LAE, moderate RAE, PASP 47  Echo 03/02/17 EF 55-60, normal wall motion, mild AI, ascending aorta 38 (mildly dilated), mild MR, severe LAE, mild RAE, mild TR, PASP 32  Echo 01/01/15 EF 55-60, normal wall motion, moderate AI, severe LAE, moderate RAE, PASP 42  The patient does not have symptoms concerning for COVID-19 infection (fever, chills, cough, or new shortness of breath).    Past Medical History:  Diagnosis Date  . Aortic atherosclerosis ([place])    CT 11/2017  . Arthritis    "hands, feet" (12/08/2017)  . Breast cancer, left breast ([place]) 1987  . Permanent atrial fibrillation   . Pneumonia    "couple times" (12/08/2017)  . SBE (subacute bacterial endocarditis) prophylaxis candidate    for MR, AI   Past Surgical History:  Procedure Laterality Date  . APPENDECTOMY    . BREAST BIOPSY Left 1987  . MASTECTOMY Left 1987   With immediate Reconstruction  . TUBAL LIGATION    . US ECHOCARDIOGRAPHY  10/2010   EF- 55-60%, mild  MR, mild AR     Current Meds  Medication Sig  . acetaminophen (TYLENOL) 500 MG tablet Take 1,000 mg by mouth every 8 (eight) hours as needed for mild pain.   Marland Kitchen diltiazem (CARDIZEM CD) 120 MG 24 hr capsule TAKE 1 CAPSULE EVERY DAY  . ELIQUIS 5 MG  TABS tablet TAKE 1 TABLET TWICE DAILY  . metoprolol succinate (TOPROL-XL) 25 MG 24 hr tablet TAKE 3 TABLETS EVERY DAY  . Multiple Vitamins-Minerals (MULTIVITAMIN WITH MINERALS) tablet Take 1 tablet by mouth.  . Multiple Vitamins-Minerals (PRESERVISION AREDS 2) CAPS Take by mouth. 2 capsules daily  . zoledronic acid (RECLAST) 5 MG/100ML SOLN injection Inject 5 mg into the vein See admin instructions. Yearly      Allergies:   Shellfish allergy, Shellfish-derived products, and Tamiflu [oseltamivir phosphate]    Social History   Tobacco Use  . Smoking status: Never Smoker  . Smokeless tobacco: Never Used  Substance Use Topics  . Alcohol use: Yes    Alcohol/week: 2.0 standard drinks    Types: 2 Glasses of wine per week  . Drug use: No     Family Hx: The patient's family history includes Heart disease in her father and mother; Hypertension in her father and mother.  ROS:   Please see the history of present illness.    All other systems reviewed and are negative.   Prior CV studies:   The following studies were reviewed today:   Labs/Other Tests and Data Reviewed:    EKG:  No ECG reviewed.  Recent Labs: No results found for requested labs within last 8760 hours.   Recent Lipid Panel No results found for: CHOL, TRIG, HDL, CHOLHDL, LDLCALC, LDLDIRECT  Wt Readings from Last 3 Encounters:  05/10/19 155 lb (70.3 kg)  03/30/18 173 lb 9.6 oz (78.7 kg)  01/25/18 174 lb 6.4 oz (79.1 kg)    Objective:    Vital Signs:  BP (!) 141/82   Pulse 70   Wt 155 lb (70.3 kg)   BMI 27.46 kg/m    VITAL SIGNS:  reviewed   ASSESSMENT & PLAN:    1. Chronic diastolic CHF - sec to recent FluA, RSV with sepsis and pneumonia -No recurrent symptoms, no diuretics needed.  2. A-fib with RVR - in persistent a-fib - now with RVR - CHADS-VASc 3, on chronic therapy with Eliquis, no bleeding, hemoglobin normal year ago awaiting labs this year. -Continue Toprol to 75 mg PO daily, continue cardizem 120 mg po daily  3. Mild to moderate aortic insufficiency by echocardiogram -stable on most recent echocardiogram in February 2019, we will see in 6 months and decide if we need to repeat.  4. Hypertension - well controlled.  COVID-19 Education: The signs and symptoms of COVID-19 were discussed with the patient and how to seek care for testing (follow up with PCP or arrange E-visit).  The importance of social distancing was discussed today.  Time:   Today, I have spent 25 minutes with the patient with  telehealth technology discussing the above problems.     Medication Adjustments/Labs and Tests Ordered: Current medicines are reviewed at length with the patient today.  Concerns regarding medicines are outlined above.   Tests Ordered: No orders of the defined types were placed in this encounter.   Medication Changes: No orders of the defined types were placed in this encounter.   Follow Up:  Virtual Visit or In Person in 6 month(s)  Signed, Ena Dawley, MD  05/10/2019 8:31 AM    [place]

## 2019-05-15 ENCOUNTER — Other Ambulatory Visit: Payer: Self-pay | Admitting: Cardiology

## 2019-05-15 DIAGNOSIS — I5043 Acute on chronic combined systolic (congestive) and diastolic (congestive) heart failure: Secondary | ICD-10-CM

## 2019-05-15 DIAGNOSIS — I4891 Unspecified atrial fibrillation: Secondary | ICD-10-CM

## 2019-05-25 DIAGNOSIS — Z79899 Other long term (current) drug therapy: Secondary | ICD-10-CM | POA: Diagnosis not present

## 2019-05-25 DIAGNOSIS — I5032 Chronic diastolic (congestive) heart failure: Secondary | ICD-10-CM | POA: Diagnosis not present

## 2019-05-25 DIAGNOSIS — R8290 Unspecified abnormal findings in urine: Secondary | ICD-10-CM | POA: Diagnosis not present

## 2019-05-25 DIAGNOSIS — E782 Mixed hyperlipidemia: Secondary | ICD-10-CM | POA: Diagnosis not present

## 2019-05-25 DIAGNOSIS — Z13220 Encounter for screening for lipoid disorders: Secondary | ICD-10-CM | POA: Diagnosis not present

## 2019-05-25 DIAGNOSIS — Z853 Personal history of malignant neoplasm of breast: Secondary | ICD-10-CM | POA: Diagnosis not present

## 2019-05-25 DIAGNOSIS — R7301 Impaired fasting glucose: Secondary | ICD-10-CM | POA: Diagnosis not present

## 2019-05-25 DIAGNOSIS — I4891 Unspecified atrial fibrillation: Secondary | ICD-10-CM | POA: Diagnosis not present

## 2019-05-25 DIAGNOSIS — Z0000 Encounter for general adult medical examination without abnormal findings: Secondary | ICD-10-CM | POA: Diagnosis not present

## 2019-05-25 DIAGNOSIS — Z23 Encounter for immunization: Secondary | ICD-10-CM | POA: Diagnosis not present

## 2019-05-25 DIAGNOSIS — M810 Age-related osteoporosis without current pathological fracture: Secondary | ICD-10-CM | POA: Diagnosis not present

## 2019-06-01 DIAGNOSIS — M8589 Other specified disorders of bone density and structure, multiple sites: Secondary | ICD-10-CM | POA: Diagnosis not present

## 2019-06-01 DIAGNOSIS — Z853 Personal history of malignant neoplasm of breast: Secondary | ICD-10-CM | POA: Diagnosis not present

## 2019-06-01 DIAGNOSIS — R29890 Loss of height: Secondary | ICD-10-CM | POA: Diagnosis not present

## 2019-07-17 DIAGNOSIS — Z23 Encounter for immunization: Secondary | ICD-10-CM | POA: Diagnosis not present

## 2019-07-30 ENCOUNTER — Other Ambulatory Visit: Payer: Self-pay | Admitting: Cardiology

## 2019-08-13 DIAGNOSIS — H353131 Nonexudative age-related macular degeneration, bilateral, early dry stage: Secondary | ICD-10-CM | POA: Diagnosis not present

## 2019-08-13 DIAGNOSIS — H25813 Combined forms of age-related cataract, bilateral: Secondary | ICD-10-CM | POA: Diagnosis not present

## 2019-09-04 ENCOUNTER — Other Ambulatory Visit: Payer: Self-pay

## 2019-09-04 MED ORDER — APIXABAN 5 MG PO TABS: 5.0000 mg | ORAL_TABLET | Freq: Two times a day (BID) | ORAL | 2 refills | Status: DC

## 2019-09-04 NOTE — Telephone Encounter (Signed)
Pt last saw Dr [name] 05/10/19, last labs 05/25/19 Creat 0.79, age 78, weight 70.3, based on specified criteria pt is on appropriate dosage of Eliquis 5mg  BID.  Will refill rx.

## 2019-09-06 ENCOUNTER — Other Ambulatory Visit: Payer: Self-pay | Admitting: *Deleted

## 2019-09-06 MED ORDER — APIXABAN 5 MG PO TABS: 5.0000 mg | ORAL_TABLET | Freq: Two times a day (BID) | ORAL | 2 refills | Status: DC

## 2019-09-06 NOTE — Telephone Encounter (Signed)
Eliquis 5mg  refill request received, pt is 78 yrs old, weight-70.3kg, Crea-0.79 on 05/25/2019, Diagnosis-Afib, and last seen by Dr. [name] on 05/10/2019. Dose is appropriate based on dosing criteria. Will send in refill to requested pharmacy.

## 2019-09-14 DIAGNOSIS — Z79899 Other long term (current) drug therapy: Secondary | ICD-10-CM | POA: Diagnosis not present

## 2019-10-02 ENCOUNTER — Encounter (HOSPITAL_COMMUNITY): Payer: Medicare HMO

## 2019-10-08 ENCOUNTER — Other Ambulatory Visit (HOSPITAL_COMMUNITY): Payer: Self-pay | Admitting: *Deleted

## 2019-10-09 ENCOUNTER — Ambulatory Visit (HOSPITAL_COMMUNITY)
Admission: RE | Admit: 2019-10-09 | Discharge: 2019-10-09 | Disposition: A | Discharge status: Home / Self Care | Payer: Medicare HMO | Source: Ambulatory Visit | Attending: Family Medicine | Admitting: Family Medicine

## 2019-10-09 ENCOUNTER — Other Ambulatory Visit: Payer: Self-pay

## 2019-10-09 DIAGNOSIS — M810 Age-related osteoporosis without current pathological fracture: Secondary | ICD-10-CM | POA: Insufficient documentation

## 2019-10-09 MED ORDER — ZOLEDRONIC ACID 5 MG/100ML IV SOLN
INTRAVENOUS | Status: AC
  Administered 2019-10-09: 12:00:00 5 mg via INTRAVENOUS
  Filled 2019-10-09: qty 100

## 2019-10-09 MED ORDER — ZOLEDRONIC ACID 5 MG/100ML IV SOLN: 5.0000 mg | Freq: Once | INTRAVENOUS | Status: AC

## 2019-10-15 DIAGNOSIS — Z1231 Encounter for screening mammogram for malignant neoplasm of breast: Secondary | ICD-10-CM | POA: Diagnosis not present

## 2019-10-24 DIAGNOSIS — J45909 Unspecified asthma, uncomplicated: Secondary | ICD-10-CM | POA: Diagnosis not present

## 2019-10-24 DIAGNOSIS — C50919 Malignant neoplasm of unspecified site of unspecified female breast: Secondary | ICD-10-CM | POA: Diagnosis not present

## 2019-10-24 DIAGNOSIS — E782 Mixed hyperlipidemia: Secondary | ICD-10-CM | POA: Diagnosis not present

## 2019-10-24 DIAGNOSIS — M810 Age-related osteoporosis without current pathological fracture: Secondary | ICD-10-CM | POA: Diagnosis not present

## 2019-10-24 DIAGNOSIS — Z853 Personal history of malignant neoplasm of breast: Secondary | ICD-10-CM | POA: Diagnosis not present

## 2019-10-24 DIAGNOSIS — I482 Chronic atrial fibrillation, unspecified: Secondary | ICD-10-CM | POA: Diagnosis not present

## 2019-11-30 DIAGNOSIS — E782 Mixed hyperlipidemia: Secondary | ICD-10-CM | POA: Diagnosis not present

## 2019-11-30 DIAGNOSIS — M810 Age-related osteoporosis without current pathological fracture: Secondary | ICD-10-CM | POA: Diagnosis not present

## 2019-11-30 DIAGNOSIS — J45909 Unspecified asthma, uncomplicated: Secondary | ICD-10-CM | POA: Diagnosis not present

## 2019-11-30 DIAGNOSIS — Z853 Personal history of malignant neoplasm of breast: Secondary | ICD-10-CM | POA: Diagnosis not present

## 2019-11-30 DIAGNOSIS — I4821 Permanent atrial fibrillation: Secondary | ICD-10-CM | POA: Diagnosis not present

## 2019-11-30 DIAGNOSIS — C50919 Malignant neoplasm of unspecified site of unspecified female breast: Secondary | ICD-10-CM | POA: Diagnosis not present

## 2019-11-30 DIAGNOSIS — I4891 Unspecified atrial fibrillation: Secondary | ICD-10-CM | POA: Diagnosis not present

## 2019-12-25 DIAGNOSIS — I358 Other nonrheumatic aortic valve disorders: Secondary | ICD-10-CM | POA: Diagnosis not present

## 2019-12-25 DIAGNOSIS — Z853 Personal history of malignant neoplasm of breast: Secondary | ICD-10-CM | POA: Diagnosis not present

## 2019-12-25 DIAGNOSIS — Z23 Encounter for immunization: Secondary | ICD-10-CM | POA: Diagnosis not present

## 2019-12-25 DIAGNOSIS — R944 Abnormal results of kidney function studies: Secondary | ICD-10-CM | POA: Diagnosis not present

## 2019-12-25 DIAGNOSIS — M810 Age-related osteoporosis without current pathological fracture: Secondary | ICD-10-CM | POA: Diagnosis not present

## 2019-12-25 DIAGNOSIS — R7301 Impaired fasting glucose: Secondary | ICD-10-CM | POA: Diagnosis not present

## 2019-12-25 DIAGNOSIS — I4891 Unspecified atrial fibrillation: Secondary | ICD-10-CM | POA: Diagnosis not present

## 2019-12-25 DIAGNOSIS — E782 Mixed hyperlipidemia: Secondary | ICD-10-CM | POA: Diagnosis not present

## 2019-12-25 DIAGNOSIS — Z0000 Encounter for general adult medical examination without abnormal findings: Secondary | ICD-10-CM | POA: Diagnosis not present

## 2020-01-11 DIAGNOSIS — R3915 Urgency of urination: Secondary | ICD-10-CM | POA: Diagnosis not present

## 2020-01-16 ENCOUNTER — Ambulatory Visit: Payer: Medicare HMO | Admitting: Cardiology

## 2020-01-16 ENCOUNTER — Encounter: Payer: Self-pay | Admitting: Cardiology

## 2020-01-16 ENCOUNTER — Other Ambulatory Visit: Payer: Self-pay

## 2020-01-16 VITALS — BP 128/72 | HR 89 | Ht 63.0 in | Wt 163.0 lb

## 2020-01-16 DIAGNOSIS — Z7901 Long term (current) use of anticoagulants: Secondary | ICD-10-CM

## 2020-01-16 DIAGNOSIS — I482 Chronic atrial fibrillation, unspecified: Secondary | ICD-10-CM | POA: Diagnosis not present

## 2020-01-16 DIAGNOSIS — E785 Hyperlipidemia, unspecified: Secondary | ICD-10-CM | POA: Diagnosis not present

## 2020-01-16 DIAGNOSIS — I119 Hypertensive heart disease without heart failure: Secondary | ICD-10-CM | POA: Diagnosis not present

## 2020-01-16 DIAGNOSIS — I351 Nonrheumatic aortic (valve) insufficiency: Secondary | ICD-10-CM

## 2020-01-16 DIAGNOSIS — Z5181 Encounter for therapeutic drug level monitoring: Secondary | ICD-10-CM | POA: Diagnosis not present

## 2020-01-16 DIAGNOSIS — E782 Mixed hyperlipidemia: Secondary | ICD-10-CM

## 2020-01-16 DIAGNOSIS — I5032 Chronic diastolic (congestive) heart failure: Secondary | ICD-10-CM | POA: Diagnosis not present

## 2020-01-16 MED ORDER — ROSUVASTATIN CALCIUM 10 MG PO TABS: 10.0000 mg | ORAL_TABLET | Freq: Every day | ORAL | 0 refills | Status: DC

## 2020-01-16 NOTE — Patient Instructions (Signed)
Medication Instructions:   START TAKING ROSUVASTATIN (CRESTOR) 10 MG BY MOUTH DAILY  *If you need a refill on your cardiac medications before your next appointment, please call your pharmacy*   Lab Work:  IN 4 WEEKS--SAME DAY AS YOUR ECHO--WE WILL CHECK CMET, CBC, LIPIDS, HEMOGLOBIN A1C, AND PT/INR--PLEASE COME FASTING TO THIS LAB APPOINTMENT.  If you have labs (blood work) drawn today and your tests are completely normal, you will receive your results only by: Marland Kitchen MyChart Message (if you have MyChart) OR . A paper copy in the mail If you have any lab test that is abnormal or we need to change your treatment, we will call you to review the results.  Testing/Procedures:  Your physician has requested that you have an echocardiogram. Echocardiography is a painless test that uses sound waves to create images of your heart. It provides your doctor with information about the size and shape of your heart and how well your heart's chambers and valves are working. This procedure takes approximately one hour. There are no restrictions for this procedure. NEEDS ECHO SCHEDULED FOR 4 WEEKS OUT, SAME DAY AS HER LAB APPOINTMENT IN THE OFFICE, PER DR. NELSON--PLEASE SCHEDULE LAB AND ECHO SAME DAY.  Follow-Up: At [facility], you and your health needs are our priority.  As part of our continuing mission to provide you with exceptional heart care, we have created designated Provider Care Teams.  These Care Teams include your primary Cardiologist (physician) and Advanced Practice Providers (APPs -  Physician Assistants and Nurse Practitioners) who all work together to provide you with the care you need, when you need it.  We recommend signing up for the patient portal called "MyChart".  Sign up information is provided on this After Visit Summary.  MyChart is used to connect with patients for Virtual Visits (Telemedicine).  Patients are able to view lab/test results, encounter notes, upcoming appointments, etc.   Non-urgent messages can be sent to your provider as well.   To learn more about what you can do with MyChart, go to [email or web address].    Your next appointment:   12 month(s)  The format for your next appointment:   In Person  Provider:   Ena Dawley, MD

## 2020-01-16 NOTE — Progress Notes (Signed)
Cardiology Office Note:    Date:  01/16/2020   ID:  Kathryn Chang, DOB Jan 30, 1941, MRN [id number]  PCP:  [name], MD  Cardiologist:  Ena Dawley, MD  Electrophysiologist:  None   Referring MD: [name], MD   No chief complaint on file.  History of Present Illness:    Kathryn Chang is a 79 y.o. female with a hx of permanent atrial fibrillation on long-term anticoagulation with Apixaban, aortic insufficiency.  She was admitted in February 2019 with influenza complicated by pneumonia and sepsis as well as a atrial fibrillation with rapid ventricular rate and volume overload.  She was treated with IV Lasix.  She was last seen in follow-up by Dr. Meda Chang 12/22/17.    01/16/2020 -the patient is coming after 1 year, she has been doing well, she walks several times a week for half an hour without any chest pain or shortness of breath.  She denies any palpitation dizziness or syncope.  No bleeding on Eliquis.  No orthopnea or proximal nocturnal dyspnea.   Prior CV studies:   The following studies were reviewed today:  Echo 12/09/17 Diffuse HK worse in the mid and basal inferior wall, EF 50-55, mild to moderate AI, mild MR, moderate LAE, moderate RAE, PASP 47  Echo 03/02/17 EF 55-60, normal wall motion, mild AI, ascending aorta 38 (mildly dilated), mild MR, severe LAE, mild RAE, mild TR, PASP 32  Echo 01/01/15 EF 55-60, normal wall motion, moderate AI, severe LAE, moderate RAE, PASP 42   Past Medical History:  Diagnosis Date  . Aortic atherosclerosis ([place])    CT 11/2017  . Arthritis    "hands, feet" (12/08/2017)  . Breast cancer, left breast ([place]) 1987  . Permanent atrial fibrillation ([place])   . Pneumonia    "couple times" (12/08/2017)  . SBE (subacute bacterial endocarditis) prophylaxis candidate    for MR, AI    Past Surgical History:  Procedure Laterality Date  . APPENDECTOMY    . BREAST BIOPSY Left 1987  . MASTECTOMY Left 1987   With immediate  Reconstruction  . TUBAL LIGATION    . US ECHOCARDIOGRAPHY  10/2010   EF- 55-60%, mild  MR, mild AR    Current Medications: Current Meds  Medication Sig  . acetaminophen (TYLENOL) 500 MG tablet Take 1,000 mg by mouth every 8 (eight) hours as needed for mild pain.   Marland Kitchen apixaban (ELIQUIS) 5 MG TABS tablet Take 1 tablet (5 mg total) by mouth 2 (two) times daily.  Marland Kitchen diltiazem (CARDIZEM CD) 120 MG 24 hr capsule Take 1 capsule (120 mg total) by mouth daily.  Marland Kitchen EPINEPHrine (EPIPEN 2-PAK) 0.3 mg/0.3 mL IJ SOAJ injection Inject 0.3 mg into the muscle as needed for anaphylaxis.  . metoprolol succinate (TOPROL-XL) 25 MG 24 hr tablet TAKE 3 TABLETS EVERY DAY  . Multiple Vitamins-Minerals (MULTIVITAMIN WITH MINERALS) tablet Take 1 tablet by mouth.  . Multiple Vitamins-Minerals (PRESERVISION AREDS 2) CAPS Take by mouth. 2 capsules daily  . zoledronic acid (RECLAST) 5 MG/100ML SOLN injection Inject 5 mg into the vein See admin instructions. Yearly      Allergies:   Shellfish allergy, Shellfish-derived products, and Tamiflu [oseltamivir phosphate]   Social History   Socioeconomic History  . Marital status: Married    Spouse name: Not on file  . Number of children: Not on file  . Years of education: Not on file  . Highest education level: Not on file  Occupational History  . Not on file  Tobacco Use  . Smoking status: Never Smoker  . Smokeless tobacco: Never Used  Substance and Sexual Activity  . Alcohol use: Yes    Alcohol/week: 2.0 standard drinks    Types: 2 Glasses of wine per week  . Drug use: No  . Sexual activity: Yes  Other Topics Concern  . Not on file  Social History Narrative  . Not on file   Social Determinants of Health   Financial Resource Strain:   . Difficulty of Paying Living Expenses:   Food Insecurity:   . Worried About Charity fundraiser in the Last Year:   . Arboriculturist in the Last Year:   Transportation Needs:   . Film/video editor (Medical):   Marland Kitchen Lack  of Transportation (Non-Medical):   Physical Activity:   . Days of Exercise per Week:   . Minutes of Exercise per Session:   Stress:   . Feeling of Stress :   Social Connections:   . Frequency of Communication with Friends and Family:   . Frequency of Social Gatherings with Friends and Family:   . Attends Religious Services:   . Active Member of Clubs or Organizations:   . Attends Archivist Meetings:   Marland Kitchen Marital Status:      Family History: The patient's family history includes Heart disease in her father and mother; Hypertension in her father and mother.  ROS:   Please see the history of present illness.    All other systems reviewed and are negative.  EKGs/Labs/Other Studies Reviewed:    The following studies were reviewed today:  EKG:  EKG is ordered today.  The ekg ordered today demonstrates atrial fibrillation, rate controlled, 77 bpm, nonspecific ST-T wave abnormalities, unchanged from prior, this was personally reviewed  Recent Labs: No results found for requested labs within last 8760 hours.  Recent Lipid Panel No results found for: CHOL, TRIG, HDL, CHOLHDL, VLDL, LDLCALC, LDLDIRECT  Physical Exam:    VS:  BP 128/72   Pulse 89   Ht 5' 3"  (1.6 m)   Wt 163 lb (73.9 kg)   SpO2 97%   BMI 28.87 kg/m     Wt Readings from Last 3 Encounters:  01/16/20 163 lb (73.9 kg)  05/10/19 155 lb (70.3 kg)  03/30/18 173 lb 9.6 oz (78.7 kg)     GEN:  Well nourished, well developed in no acute distress HEENT: Normal NECK: No JVD; No carotid bruits LYMPHATICS: No lymphadenopathy CARDIAC: iRRR, very mild diastolic murmurs, rubs, gallops RESPIRATORY:  Clear to auscultation without rales, wheezing or rhonchi  ABDOMEN: Soft, non-tender, non-distended MUSCULOSKELETAL:  No edema; No deformity  SKIN: Warm and dry NEUROLOGIC:  Alert and oriented x 3 PSYCHIATRIC:  Normal affect   ASSESSMENT:    1. Aortic valve insufficiency, etiology of cardiac valve disease  unspecified   2. Hypertensive heart disease without heart failure   3. Long term current use of anticoagulant therapy   4. Encounter for therapeutic drug monitoring   5. Hyperlipidemia, unspecified hyperlipidemia type   6. Chronic atrial fibrillation (HCC)   7. Mixed hyperlipidemia    PLAN:    In order of problems listed above:  1. Chronic diastolic CHF - sec to recent FluA, RSV with sepsis and pneumonia -No recurrent symptoms, no diuretics needed.  2. A-fib with RVR - in persistent a-fib -rate controlled, she is completely asymptomatic, - CHADS-VASc 3, on chronic therapy with Eliquis, no bleeding, hemoglobin normal year ago awaiting labs  this year.  3. Mild to moderate aortic insufficiency by echocardiogram -stable on most recent echocardiogram in February 2019, we will repeat now.  4. Hypertension - well controlled.  5.  Hyperlipidemia, with LDL 120 and triglycerides 150, she is exercising regularly and eats healthy diet avoiding red meat, we will start rosuvastatin 10 mg daily and repeat her labs in 4 weeks.  Medication Adjustments/Labs and Tests Ordered: Current medicines are reviewed at length with the patient today.  Concerns regarding medicines are outlined above.  Orders Placed This Encounter  Procedures  . Lipid Profile  . Comp Met (CMET)  . CBC  . Protime-INR  . HgB A1c  . EKG 12-Lead  . ECHOCARDIOGRAM COMPLETE   Meds ordered this encounter  Medications  . rosuvastatin (CRESTOR) 10 MG tablet    Sig: Take 1 tablet (10 mg total) by mouth daily.    Dispense:  90 tablet    Refill:  0    There are no Patient Instructions on file for this visit.   Signed, Ena Dawley, MD  01/16/2020 10:00 AM    [place]

## 2020-01-22 DIAGNOSIS — M810 Age-related osteoporosis without current pathological fracture: Secondary | ICD-10-CM | POA: Diagnosis not present

## 2020-01-22 DIAGNOSIS — Z03818 Encounter for observation for suspected exposure to other biological agents ruled out: Secondary | ICD-10-CM | POA: Diagnosis not present

## 2020-01-22 DIAGNOSIS — Z853 Personal history of malignant neoplasm of breast: Secondary | ICD-10-CM | POA: Diagnosis not present

## 2020-01-22 DIAGNOSIS — R509 Fever, unspecified: Secondary | ICD-10-CM | POA: Diagnosis not present

## 2020-01-22 DIAGNOSIS — I4821 Permanent atrial fibrillation: Secondary | ICD-10-CM | POA: Diagnosis not present

## 2020-01-22 DIAGNOSIS — I119 Hypertensive heart disease without heart failure: Secondary | ICD-10-CM | POA: Diagnosis not present

## 2020-01-22 DIAGNOSIS — E782 Mixed hyperlipidemia: Secondary | ICD-10-CM | POA: Diagnosis not present

## 2020-01-22 DIAGNOSIS — Z20828 Contact with and (suspected) exposure to other viral communicable diseases: Secondary | ICD-10-CM | POA: Diagnosis not present

## 2020-01-22 DIAGNOSIS — C50919 Malignant neoplasm of unspecified site of unspecified female breast: Secondary | ICD-10-CM | POA: Diagnosis not present

## 2020-01-22 DIAGNOSIS — J45909 Unspecified asthma, uncomplicated: Secondary | ICD-10-CM | POA: Diagnosis not present

## 2020-01-22 DIAGNOSIS — I4891 Unspecified atrial fibrillation: Secondary | ICD-10-CM | POA: Diagnosis not present

## 2020-01-23 DIAGNOSIS — R509 Fever, unspecified: Secondary | ICD-10-CM | POA: Diagnosis not present

## 2020-01-23 DIAGNOSIS — N390 Urinary tract infection, site not specified: Secondary | ICD-10-CM | POA: Diagnosis not present

## 2020-02-11 ENCOUNTER — Ambulatory Visit (HOSPITAL_COMMUNITY): Payer: Medicare HMO | Attending: Cardiovascular Disease

## 2020-02-11 ENCOUNTER — Other Ambulatory Visit: Payer: Self-pay

## 2020-02-11 ENCOUNTER — Other Ambulatory Visit: Payer: Medicare HMO | Admitting: *Deleted

## 2020-02-11 DIAGNOSIS — Z5181 Encounter for therapeutic drug level monitoring: Secondary | ICD-10-CM

## 2020-02-11 DIAGNOSIS — E785 Hyperlipidemia, unspecified: Secondary | ICD-10-CM

## 2020-02-11 DIAGNOSIS — I119 Hypertensive heart disease without heart failure: Secondary | ICD-10-CM

## 2020-02-11 DIAGNOSIS — I482 Chronic atrial fibrillation, unspecified: Secondary | ICD-10-CM | POA: Diagnosis not present

## 2020-02-11 DIAGNOSIS — Z7901 Long term (current) use of anticoagulants: Secondary | ICD-10-CM

## 2020-02-11 DIAGNOSIS — I351 Nonrheumatic aortic (valve) insufficiency: Secondary | ICD-10-CM

## 2020-02-11 LAB — COMPREHENSIVE METABOLIC PANEL
Albumin/Globulin Ratio: 1.7 (ref 1.2–2.2)
Albumin: 4.2 g/dL (ref 3.7–4.7)
Sodium: 143 mmol/L (ref 134–144)

## 2020-02-11 LAB — LIPID PANEL: Triglycerides: 109 mg/dL (ref 0–149)

## 2020-02-11 LAB — PROTIME-INR
INR: 0.9 (ref 0.9–1.2)
Prothrombin Time: 10.2 s (ref 9.1–12.0)

## 2020-02-11 LAB — CBC: MCH: 30.4 pg (ref 26.6–33.0)

## 2020-02-12 LAB — CBC
Hematocrit: 42 % (ref 34.0–46.6)
Hemoglobin: 13.8 g/dL (ref 11.1–15.9)
MCHC: 32.9 g/dL (ref 31.5–35.7)
MCV: 93 fL (ref 79–97)
Platelets: 261 10*3/uL (ref 150–450)
RBC: 4.54 x10E6/uL (ref 3.77–5.28)
RDW: 12.5 % (ref 11.7–15.4)
WBC: 5.1 10*3/uL (ref 3.4–10.8)

## 2020-02-12 LAB — COMPREHENSIVE METABOLIC PANEL
ALT: 35 IU/L — ABNORMAL HIGH (ref 0–32)
AST: 34 IU/L (ref 0–40)
Alkaline Phosphatase: 75 IU/L (ref 39–117)
BUN/Creatinine Ratio: 20 (ref 12–28)
BUN: 15 mg/dL (ref 8–27)
Bilirubin Total: 0.3 mg/dL (ref 0.0–1.2)
CO2: 23 mmol/L (ref 20–29)
Calcium: 9.3 mg/dL (ref 8.7–10.3)
Chloride: 103 mmol/L (ref 96–106)
Creatinine, Ser: 0.75 mg/dL (ref 0.57–1.00)
GFR calc Af Amer: 90 mL/min/{1.73_m2} (ref >59)
GFR calc non Af Amer: 78 mL/min/{1.73_m2} (ref >59)
Globulin, Total: 2.5 g/dL (ref 1.5–4.5)
Glucose: 113 mg/dL — ABNORMAL HIGH (ref 65–99)
Potassium: 4.3 mmol/L (ref 3.5–5.2)
Total Protein: 6.7 g/dL (ref 6.0–8.5)

## 2020-02-12 LAB — LIPID PANEL
Chol/HDL Ratio: 2.1 ratio (ref 0.0–4.4)
Cholesterol, Total: 121 mg/dL (ref 100–199)
HDL: 59 mg/dL (ref >39)
LDL Chol Calc (NIH): 42 mg/dL (ref 0–99)
VLDL Cholesterol Cal: 20 mg/dL (ref 5–40)

## 2020-02-12 LAB — HEMOGLOBIN A1C
Est. average glucose Bld gHb Est-mCnc: 120 mg/dL
Hgb A1c MFr Bld: 5.8 % — ABNORMAL HIGH (ref 4.8–5.6)

## 2020-02-20 DIAGNOSIS — M810 Age-related osteoporosis without current pathological fracture: Secondary | ICD-10-CM | POA: Diagnosis not present

## 2020-02-20 DIAGNOSIS — I4891 Unspecified atrial fibrillation: Secondary | ICD-10-CM | POA: Diagnosis not present

## 2020-02-20 DIAGNOSIS — I119 Hypertensive heart disease without heart failure: Secondary | ICD-10-CM | POA: Diagnosis not present

## 2020-02-20 DIAGNOSIS — J45909 Unspecified asthma, uncomplicated: Secondary | ICD-10-CM | POA: Diagnosis not present

## 2020-02-20 DIAGNOSIS — C50919 Malignant neoplasm of unspecified site of unspecified female breast: Secondary | ICD-10-CM | POA: Diagnosis not present

## 2020-02-20 DIAGNOSIS — E782 Mixed hyperlipidemia: Secondary | ICD-10-CM | POA: Diagnosis not present

## 2020-02-20 DIAGNOSIS — I4821 Permanent atrial fibrillation: Secondary | ICD-10-CM | POA: Diagnosis not present

## 2020-02-20 DIAGNOSIS — Z853 Personal history of malignant neoplasm of breast: Secondary | ICD-10-CM | POA: Diagnosis not present

## 2020-03-13 ENCOUNTER — Other Ambulatory Visit: Payer: Self-pay

## 2020-03-13 DIAGNOSIS — Z5181 Encounter for therapeutic drug level monitoring: Secondary | ICD-10-CM

## 2020-03-13 DIAGNOSIS — I351 Nonrheumatic aortic (valve) insufficiency: Secondary | ICD-10-CM

## 2020-03-13 DIAGNOSIS — I119 Hypertensive heart disease without heart failure: Secondary | ICD-10-CM

## 2020-03-13 DIAGNOSIS — Z7901 Long term (current) use of anticoagulants: Secondary | ICD-10-CM

## 2020-03-13 DIAGNOSIS — E785 Hyperlipidemia, unspecified: Secondary | ICD-10-CM

## 2020-03-13 MED ORDER — ROSUVASTATIN CALCIUM 10 MG PO TABS: 10.0000 mg | ORAL_TABLET | Freq: Every day | ORAL | 3 refills | Status: DC

## 2020-03-13 NOTE — Telephone Encounter (Signed)
Pt's medication was sent to pt's pharmacy as requested. Confirmation received.  °

## 2020-03-26 DIAGNOSIS — N200 Calculus of kidney: Secondary | ICD-10-CM | POA: Diagnosis not present

## 2020-03-31 DIAGNOSIS — N200 Calculus of kidney: Secondary | ICD-10-CM | POA: Diagnosis not present

## 2020-04-02 DIAGNOSIS — R7301 Impaired fasting glucose: Secondary | ICD-10-CM | POA: Diagnosis not present

## 2020-04-02 DIAGNOSIS — R3915 Urgency of urination: Secondary | ICD-10-CM | POA: Diagnosis not present

## 2020-04-11 ENCOUNTER — Other Ambulatory Visit: Payer: Self-pay | Admitting: Cardiology

## 2020-04-11 DIAGNOSIS — I5043 Acute on chronic combined systolic (congestive) and diastolic (congestive) heart failure: Secondary | ICD-10-CM

## 2020-04-11 DIAGNOSIS — I4891 Unspecified atrial fibrillation: Secondary | ICD-10-CM

## 2020-04-14 ENCOUNTER — Telehealth: Payer: Self-pay | Admitting: Cardiology

## 2020-04-14 NOTE — Telephone Encounter (Signed)
Patient called to request a refill on her metoprolol. I informed her it had been sent to the pharmacy today.

## 2020-04-22 DIAGNOSIS — I119 Hypertensive heart disease without heart failure: Secondary | ICD-10-CM | POA: Diagnosis not present

## 2020-04-22 DIAGNOSIS — I4891 Unspecified atrial fibrillation: Secondary | ICD-10-CM | POA: Diagnosis not present

## 2020-04-22 DIAGNOSIS — C50919 Malignant neoplasm of unspecified site of unspecified female breast: Secondary | ICD-10-CM | POA: Diagnosis not present

## 2020-04-22 DIAGNOSIS — I482 Chronic atrial fibrillation, unspecified: Secondary | ICD-10-CM | POA: Diagnosis not present

## 2020-04-22 DIAGNOSIS — Z853 Personal history of malignant neoplasm of breast: Secondary | ICD-10-CM | POA: Diagnosis not present

## 2020-04-22 DIAGNOSIS — J45909 Unspecified asthma, uncomplicated: Secondary | ICD-10-CM | POA: Diagnosis not present

## 2020-04-22 DIAGNOSIS — M810 Age-related osteoporosis without current pathological fracture: Secondary | ICD-10-CM | POA: Diagnosis not present

## 2020-04-22 DIAGNOSIS — E782 Mixed hyperlipidemia: Secondary | ICD-10-CM | POA: Diagnosis not present

## 2020-05-15 ENCOUNTER — Other Ambulatory Visit: Payer: Self-pay | Admitting: Cardiology

## 2020-05-16 MED ORDER — DILTIAZEM HCL ER COATED BEADS 120 MG PO CP24: 120.0000 mg | ORAL_CAPSULE | Freq: Every day | ORAL | 2 refills | Status: DC

## 2020-05-16 NOTE — Telephone Encounter (Signed)
Prescription refill request for Eliquis received. Indication: Atrial Fibrillation Last office visit: 01/16/2020 Dr [name] Scr: 0.75  02/11/2020 Age: 79 Weight: 73.9 kg  Prescription refilled

## 2020-05-27 DIAGNOSIS — Z853 Personal history of malignant neoplasm of breast: Secondary | ICD-10-CM | POA: Diagnosis not present

## 2020-05-27 DIAGNOSIS — C50919 Malignant neoplasm of unspecified site of unspecified female breast: Secondary | ICD-10-CM | POA: Diagnosis not present

## 2020-05-27 DIAGNOSIS — M810 Age-related osteoporosis without current pathological fracture: Secondary | ICD-10-CM | POA: Diagnosis not present

## 2020-05-27 DIAGNOSIS — E782 Mixed hyperlipidemia: Secondary | ICD-10-CM | POA: Diagnosis not present

## 2020-05-27 DIAGNOSIS — I10 Essential (primary) hypertension: Secondary | ICD-10-CM | POA: Diagnosis not present

## 2020-05-27 DIAGNOSIS — J45909 Unspecified asthma, uncomplicated: Secondary | ICD-10-CM | POA: Diagnosis not present

## 2020-05-27 DIAGNOSIS — I119 Hypertensive heart disease without heart failure: Secondary | ICD-10-CM | POA: Diagnosis not present

## 2020-05-27 DIAGNOSIS — I4891 Unspecified atrial fibrillation: Secondary | ICD-10-CM | POA: Diagnosis not present

## 2020-06-05 DIAGNOSIS — I700 Atherosclerosis of aorta: Secondary | ICD-10-CM | POA: Diagnosis not present

## 2020-06-05 DIAGNOSIS — I4891 Unspecified atrial fibrillation: Secondary | ICD-10-CM | POA: Diagnosis not present

## 2020-06-05 DIAGNOSIS — I351 Nonrheumatic aortic (valve) insufficiency: Secondary | ICD-10-CM | POA: Diagnosis not present

## 2020-06-05 DIAGNOSIS — Z853 Personal history of malignant neoplasm of breast: Secondary | ICD-10-CM | POA: Diagnosis not present

## 2020-06-05 DIAGNOSIS — I119 Hypertensive heart disease without heart failure: Secondary | ICD-10-CM | POA: Diagnosis not present

## 2020-06-05 DIAGNOSIS — Z8739 Personal history of other diseases of the musculoskeletal system and connective tissue: Secondary | ICD-10-CM | POA: Diagnosis not present

## 2020-06-05 DIAGNOSIS — Z86010 Personal history of colonic polyps: Secondary | ICD-10-CM | POA: Diagnosis not present

## 2020-06-05 DIAGNOSIS — R7303 Prediabetes: Secondary | ICD-10-CM | POA: Diagnosis not present

## 2020-06-05 DIAGNOSIS — Z0000 Encounter for general adult medical examination without abnormal findings: Secondary | ICD-10-CM | POA: Diagnosis not present

## 2020-08-14 DIAGNOSIS — H353131 Nonexudative age-related macular degeneration, bilateral, early dry stage: Secondary | ICD-10-CM | POA: Diagnosis not present

## 2020-08-14 DIAGNOSIS — H25813 Combined forms of age-related cataract, bilateral: Secondary | ICD-10-CM | POA: Diagnosis not present

## 2020-08-18 DIAGNOSIS — C50919 Malignant neoplasm of unspecified site of unspecified female breast: Secondary | ICD-10-CM | POA: Diagnosis not present

## 2020-08-18 DIAGNOSIS — M810 Age-related osteoporosis without current pathological fracture: Secondary | ICD-10-CM | POA: Diagnosis not present

## 2020-08-18 DIAGNOSIS — I10 Essential (primary) hypertension: Secondary | ICD-10-CM | POA: Diagnosis not present

## 2020-08-18 DIAGNOSIS — Z853 Personal history of malignant neoplasm of breast: Secondary | ICD-10-CM | POA: Diagnosis not present

## 2020-08-18 DIAGNOSIS — E782 Mixed hyperlipidemia: Secondary | ICD-10-CM | POA: Diagnosis not present

## 2020-08-18 DIAGNOSIS — J45909 Unspecified asthma, uncomplicated: Secondary | ICD-10-CM | POA: Diagnosis not present

## 2020-08-18 DIAGNOSIS — I4891 Unspecified atrial fibrillation: Secondary | ICD-10-CM | POA: Diagnosis not present

## 2020-08-18 DIAGNOSIS — I4821 Permanent atrial fibrillation: Secondary | ICD-10-CM | POA: Diagnosis not present

## 2020-08-18 DIAGNOSIS — I119 Hypertensive heart disease without heart failure: Secondary | ICD-10-CM | POA: Diagnosis not present

## 2020-08-29 ENCOUNTER — Ambulatory Visit (INDEPENDENT_AMBULATORY_CARE_PROVIDER_SITE_OTHER): Payer: Medicare HMO

## 2020-08-29 ENCOUNTER — Other Ambulatory Visit: Payer: Self-pay

## 2020-08-29 ENCOUNTER — Encounter: Payer: Self-pay | Admitting: Podiatry

## 2020-08-29 ENCOUNTER — Ambulatory Visit: Payer: Medicare HMO | Admitting: Podiatry

## 2020-08-29 DIAGNOSIS — M778 Other enthesopathies, not elsewhere classified: Secondary | ICD-10-CM | POA: Diagnosis not present

## 2020-08-29 DIAGNOSIS — M779 Enthesopathy, unspecified: Secondary | ICD-10-CM | POA: Diagnosis not present

## 2020-08-29 DIAGNOSIS — M7752 Other enthesopathy of left foot: Secondary | ICD-10-CM | POA: Diagnosis not present

## 2020-08-29 DIAGNOSIS — M7751 Other enthesopathy of right foot: Secondary | ICD-10-CM | POA: Diagnosis not present

## 2020-08-30 NOTE — Progress Notes (Signed)
Subjective:   Patient ID: Kathryn Chang, female   DOB: 79 y.o.   MRN: [id number]   HPI Patient states she has developed pain in the top of both feet over the last few months and is done very well with medication in the past   ROS      Objective:  Physical Exam  Neurovascular status intact with extensor tendon inflammation bilateral with probably midtarsal joint arthritis     Assessment:  Extensor tendinitis bilateral with inflammation pain     Plan:  H&P reviewed condition sterile prep done injected the extensor tendon complex bilateral 3 mg Dexasone Kenalog 5 mg Xylocaine and advised this patient on continued conservative care and utilization of topical medicines.  Patient will be seen back to recheck  X-rays indicate that there is multiple signs of midtarsal joint arthritis bilateral which has not worsened since the previous x-rays several years ago

## 2020-09-23 DIAGNOSIS — Z79899 Other long term (current) drug therapy: Secondary | ICD-10-CM | POA: Diagnosis not present

## 2020-10-06 ENCOUNTER — Ambulatory Visit (HOSPITAL_COMMUNITY): Payer: Medicare HMO

## 2020-10-06 DIAGNOSIS — N200 Calculus of kidney: Secondary | ICD-10-CM | POA: Diagnosis not present

## 2020-10-09 DIAGNOSIS — E782 Mixed hyperlipidemia: Secondary | ICD-10-CM | POA: Diagnosis not present

## 2020-10-09 DIAGNOSIS — Z853 Personal history of malignant neoplasm of breast: Secondary | ICD-10-CM | POA: Diagnosis not present

## 2020-10-09 DIAGNOSIS — I4891 Unspecified atrial fibrillation: Secondary | ICD-10-CM | POA: Diagnosis not present

## 2020-10-09 DIAGNOSIS — I119 Hypertensive heart disease without heart failure: Secondary | ICD-10-CM | POA: Diagnosis not present

## 2020-10-09 DIAGNOSIS — I10 Essential (primary) hypertension: Secondary | ICD-10-CM | POA: Diagnosis not present

## 2020-10-09 DIAGNOSIS — M810 Age-related osteoporosis without current pathological fracture: Secondary | ICD-10-CM | POA: Diagnosis not present

## 2020-10-09 DIAGNOSIS — J45909 Unspecified asthma, uncomplicated: Secondary | ICD-10-CM | POA: Diagnosis not present

## 2020-10-09 DIAGNOSIS — C50919 Malignant neoplasm of unspecified site of unspecified female breast: Secondary | ICD-10-CM | POA: Diagnosis not present

## 2020-10-20 ENCOUNTER — Other Ambulatory Visit (HOSPITAL_COMMUNITY): Payer: Self-pay | Admitting: *Deleted

## 2020-10-21 ENCOUNTER — Ambulatory Visit (HOSPITAL_COMMUNITY)
Admission: RE | Admit: 2020-10-21 | Discharge: 2020-10-21 | Disposition: A | Discharge status: Home / Self Care | Payer: Medicare HMO | Source: Ambulatory Visit | Attending: Family Medicine | Admitting: Family Medicine

## 2020-10-21 ENCOUNTER — Other Ambulatory Visit: Payer: Self-pay

## 2020-10-21 DIAGNOSIS — M810 Age-related osteoporosis without current pathological fracture: Secondary | ICD-10-CM | POA: Diagnosis not present

## 2020-10-21 DIAGNOSIS — Z1231 Encounter for screening mammogram for malignant neoplasm of breast: Secondary | ICD-10-CM | POA: Diagnosis not present

## 2020-10-21 MED ORDER — ZOLEDRONIC ACID 5 MG/100ML IV SOLN
INTRAVENOUS | Status: AC
  Filled 2020-10-21: qty 100

## 2020-10-21 MED ORDER — ZOLEDRONIC ACID 5 MG/100ML IV SOLN
5.0000 mg | Freq: Once | INTRAVENOUS | Status: AC
  Administered 2020-10-21: 5 mg via INTRAVENOUS

## 2020-10-27 ENCOUNTER — Other Ambulatory Visit: Payer: Self-pay | Admitting: Cardiology

## 2020-10-28 DIAGNOSIS — Z20828 Contact with and (suspected) exposure to other viral communicable diseases: Secondary | ICD-10-CM | POA: Diagnosis not present

## 2020-11-06 ENCOUNTER — Telehealth: Payer: Self-pay

## 2020-11-06 ENCOUNTER — Other Ambulatory Visit: Payer: Self-pay

## 2020-11-06 DIAGNOSIS — I5043 Acute on chronic combined systolic (congestive) and diastolic (congestive) heart failure: Secondary | ICD-10-CM

## 2020-11-06 DIAGNOSIS — I4891 Unspecified atrial fibrillation: Secondary | ICD-10-CM

## 2020-11-06 NOTE — Telephone Encounter (Signed)
**Note De-Identified Mckennon Zwart Obfuscation** Metoprolol PA form received from [facility] Jadavion Spoelstra fax. I have completed the form and emailed it along with office visit notes to Dr Thera Flake nurse so she can obtain her signature, date it, and to fax to [facility] at fax number written on cover letter included.

## 2020-11-06 NOTE — Telephone Encounter (Signed)
**Note De-Identified Kathryn Chang Obfuscation** I started a Metoprolol Succ quantity exception through covermymeds. Key: [id number]

## 2020-11-06 NOTE — Telephone Encounter (Signed)
Forms received via Email and physically signed by Dr. [name] and faxed back to [facility] per [place].

## 2020-11-06 NOTE — Telephone Encounter (Signed)
All pages faxed to [facility].

## 2020-11-11 NOTE — Telephone Encounter (Signed)
**Note De-Identified Kauri Garson Obfuscation** Letter received from [facility] stating that they approved the pts Metoprolol quantity exception. Approval is valid until 10/24/2021. [place] is aware of this approval as well.

## 2020-12-16 DIAGNOSIS — I119 Hypertensive heart disease without heart failure: Secondary | ICD-10-CM | POA: Diagnosis not present

## 2020-12-16 DIAGNOSIS — Z853 Personal history of malignant neoplasm of breast: Secondary | ICD-10-CM | POA: Diagnosis not present

## 2020-12-16 DIAGNOSIS — I10 Essential (primary) hypertension: Secondary | ICD-10-CM | POA: Diagnosis not present

## 2020-12-16 DIAGNOSIS — E782 Mixed hyperlipidemia: Secondary | ICD-10-CM | POA: Diagnosis not present

## 2020-12-16 DIAGNOSIS — J45909 Unspecified asthma, uncomplicated: Secondary | ICD-10-CM | POA: Diagnosis not present

## 2020-12-16 DIAGNOSIS — I4891 Unspecified atrial fibrillation: Secondary | ICD-10-CM | POA: Diagnosis not present

## 2020-12-16 DIAGNOSIS — C50919 Malignant neoplasm of unspecified site of unspecified female breast: Secondary | ICD-10-CM | POA: Diagnosis not present

## 2020-12-16 DIAGNOSIS — M810 Age-related osteoporosis without current pathological fracture: Secondary | ICD-10-CM | POA: Diagnosis not present

## 2021-01-10 NOTE — Progress Notes (Signed)
Cardiology Office Note:    Date:  01/12/2021   ID:  Kathryn Chang, DOB 01/19/1941, MRN [id number]  PCP:  [name], MD   [facility]  Cardiologist:  Ena Dawley, MD  Advanced Practice Provider:  No care team member to display Electrophysiologist:  None    Referring MD: [name], MD     History of Present Illness:    Kathryn Chang is a 80 y.o. female with a hx of permanent atrial fibrillation on long-term anticoagulation with Apixaban, chronic diastolic heart failure, and aortic insufficiency who was previously followed by Dr. [name] who now presents to clinic for follow-up.  She was admitted in February 2019 with influenza complicated by pneumonia and sepsis as well as a atrial fibrillation with rapid ventricular rate and volume overload. She was treated with IV Lasix with improvement.  Last seen by Dr. [name] on 01/16/20 where she was doing well and was walking regularly.   Today, the patient states that she is feeling well. No chest pain, SOB, palpitations. She is active and walks and does a full body weight work-out on zoom with a personal trainer 2x/week. No LE edema, orthopnea, PND. Has had some nose bleeds that stop with pressure. Otherwise, no significant bleeding issues.  Past Medical History:  Diagnosis Date  . Aortic atherosclerosis ([place])    CT 11/2017  . Arthritis    "hands, feet" (12/08/2017)  . Breast cancer, left breast ([place]) 1987  . Permanent atrial fibrillation ([place])   . Pneumonia    "couple times" (12/08/2017)  . SBE (subacute bacterial endocarditis) prophylaxis candidate    for MR, AI    Past Surgical History:  Procedure Laterality Date  . APPENDECTOMY    . BREAST BIOPSY Left 1987  . MASTECTOMY Left 1987   With immediate Reconstruction  . TUBAL LIGATION    . US ECHOCARDIOGRAPHY  10/2010   EF- 55-60%, mild  MR, mild AR    Current Medications: Current Meds  Medication Sig  . acetaminophen  (TYLENOL) 500 MG tablet Take 1,000 mg by mouth every 8 (eight) hours as needed for mild pain.   Marland Kitchen apixaban (ELIQUIS) 5 MG TABS tablet Take 1 tablet (5 mg total) by mouth 2 (two) times daily. FOLLOW UP DUE MARCH 2022. PLEASE CALL AND SCHEDULE.  Marland Kitchen diltiazem (CARDIZEM CD) 120 MG 24 hr capsule Take 1 capsule (120 mg total) by mouth daily.  Marland Kitchen EPINEPHrine 0.3 mg/0.3 mL IJ SOAJ injection Inject 0.3 mg into the muscle as needed for anaphylaxis.  . Multiple Vitamins-Minerals (MULTIVITAMIN WITH MINERALS) tablet Take 1 tablet by mouth.  . Multiple Vitamins-Minerals (PRESERVISION AREDS 2) CAPS Take by mouth. 2 capsules daily  . rosuvastatin (CRESTOR) 10 MG tablet Take 1 tablet (10 mg total) by mouth daily.  . zoledronic acid (RECLAST) 5 MG/100ML SOLN injection Inject 5 mg into the vein See admin instructions. Yearly  . [DISCONTINUED] metoprolol succinate (TOPROL-XL) 25 MG 24 hr tablet TAKE 3 TABLETS EVERY DAY     Allergies:   Shellfish allergy, Shellfish-derived products, and Tamiflu [oseltamivir phosphate]   Social History   Socioeconomic History  . Marital status: Married    Spouse name: Not on file  . Number of children: Not on file  . Years of education: Not on file  . Highest education level: Not on file  Occupational History  . Not on file  Tobacco Use  . Smoking status: Never Smoker  . Smokeless tobacco: Never Used  Vaping Use  .  Vaping Use: Never used  Substance and Sexual Activity  . Alcohol use: Yes    Alcohol/week: 2.0 standard drinks    Types: 2 Glasses of wine per week  . Drug use: No  . Sexual activity: Yes  Other Topics Concern  . Not on file  Social History Narrative  . Not on file   Social Determinants of Health   Financial Resource Strain: Not on file  Food Insecurity: Not on file  Transportation Needs: Not on file  Physical Activity: Not on file  Stress: Not on file  Social Connections: Not on file     Family History: The patient's family history includes  Heart disease in her father and mother; Hypertension in her father and mother.  ROS:   Please see the history of present illness.    Review of Systems  Constitutional: Negative for chills, fever and malaise/fatigue.  HENT: Negative for hearing loss and sore throat.   Eyes: Negative for blurred vision and redness.  Respiratory: Negative for shortness of breath.   Cardiovascular: Negative for chest pain, palpitations, orthopnea, claudication, leg swelling and PND.  Gastrointestinal: Negative for melena, nausea and vomiting.  Genitourinary: Negative for dysuria and frequency.  Musculoskeletal: Negative for falls and myalgias.  Neurological: Negative for dizziness and loss of consciousness.  Endo/Heme/Allergies: Negative for polydipsia.  Psychiatric/Behavioral: Negative for substance abuse.    EKGs/Labs/Other Studies Reviewed:    The following studies were reviewed today: Echo 12/09/17 Diffuse HK worse in the mid and basal inferior wall, EF 50-55, mild to moderate AI, mild MR, moderate LAE, moderate RAE, PASP 47  Echo 03/02/17 EF 55-60, normal wall motion, mild AI, ascending aorta 38 (mildly dilated), mild MR, severe LAE, mild RAE, mild TR, PASP 32  Echo 01/01/15 EF 55-60, normal wall motion, moderate AI, severe LAE, moderate RAE, PASP 42  EKG:  EKG is  ordered today.  The ekg ordered today demonstrates Afib with HR 79  Recent Labs: 02/11/2020: ALT 35; BUN 15; Creatinine, Ser 0.75; Hemoglobin 13.8; Platelets 261; Potassium 4.3; Sodium 143  Recent Lipid Panel    Component Value Date/Time   CHOL 121 02/11/2020 0913   TRIG 109 02/11/2020 0913   HDL 59 02/11/2020 0913   CHOLHDL 2.1 02/11/2020 0913   LDLCALC 42 02/11/2020 0913     Risk Assessment/Calculations:    CHA2DS2-VASc Score = 5  This indicates a 7.2% annual risk of stroke. The patient's score is based upon: CHF History: Yes HTN History: Yes Diabetes History: No Stroke History: No Vascular Disease History: No Age  Score: 2 Gender Score: 1   Physical Exam:    VS:  BP 122/64   Pulse 79   Ht 5\' 3"  (1.6 m)   Wt 158 lb (71.7 kg)   SpO2 96%   BMI 27.99 kg/m     Wt Readings from Last 3 Encounters:  01/12/21 158 lb (71.7 kg)  10/21/20 155 lb (70.3 kg)  01/16/20 163 lb (73.9 kg)     GEN:  Well nourished, well developed in no acute distress HEENT: Normal NECK: No JVD; No carotid bruits LYMPHATICS: No lymphadenopathy CARDIAC: Irregularly irregular, no murmurs, rubs, gallops RESPIRATORY:  Clear to auscultation without rales, wheezing or rhonchi  ABDOMEN: Soft, non-tender, non-distended MUSCULOSKELETAL:  No edema; No deformity  SKIN: Warm and dry NEUROLOGIC:  Alert and oriented x 3 PSYCHIATRIC:  Normal affect   ASSESSMENT:    1. Chronic diastolic (congestive) heart failure ([place])   2. Permanent atrial fibrillation ([place])  3. Mixed hyperlipidemia   4. Aortic valve insufficiency, etiology of cardiac valve disease unspecified   5. Hyperlipidemia, unspecified hyperlipidemia type   6. Primary hypertension    PLAN:    In order of problems listed above:  #Chronic diastolic CHF Had initial acute decompensated episode of volume overload in the setting FluA, RSV with sepsis and pneumonia. Has been stable since that time with no recurrence of symptoms. -No recurrent symptoms, no diuretics needed.  # Permanent Afib: In persistent a-fib -rate controlled, she is completely asymptomatic, -CHADS-VASc 5, on chronic therapy with Eliquis -Continue diltiazem 120mg  daily -Continue metop 75mg  XL daily  # Mild to moderate aortic insufficiency by echocardiogram: -Stable on TTE 02/11/2020 -Continue serial monitoring  # Hypertension - well controlled. -Continue dilt and metop as above  # Hyperlipidemia: LDL well controlled at 42. -Continue crestor 10mg  daily    Medication Adjustments/Labs and Tests Ordered: Current medicines are reviewed at length with the patient today.  Concerns regarding  medicines are outlined above.  Orders Placed This Encounter  Procedures  . EKG 12-Lead   Meds ordered this encounter  Medications  . metoprolol succinate (TOPROL-XL) 50 MG 24 hr tablet    Sig: Take 1.5 tablets (75 mg total) by mouth daily.    Dispense:  135 tablet    Refill:  3    Patient Instructions  Medication Instructions:  NO CHANGES *If you need a refill on your cardiac medications before your next appointment, please call your pharmacy*   Lab Work: NONE If you have labs (blood work) drawn today and your tests are completely normal, you will receive your results only by: Marland Kitchen MyChart Message (if you have MyChart) OR . A paper copy in the mail If you have any lab test that is abnormal or we need to change your treatment, we will call you to review the results.   Testing/Procedures: NONE   Follow-Up: At [facility], you and your health needs are our priority.  As part of our continuing mission to provide you with exceptional heart care, we have created designated Provider Care Teams.  These Care Teams include your primary Cardiologist (physician) and Advanced Practice Providers (APPs -  Physician Assistants and Nurse Practitioners) who all work together to provide you with the care you need, when you need it.  We recommend signing up for the patient portal called "MyChart".  Sign up information is provided on this After Visit Summary.  MyChart is used to connect with patients for Virtual Visits (Telemedicine).  Patients are able to view lab/test results, encounter notes, upcoming appointments, etc.  Non-urgent messages can be sent to your provider as well.   To learn more about what you can do with MyChart, go to [email or web address].    Your next appointment:   1 year(s)  The format for your next appointment:   In Person  Provider:   Gwyndolyn Kaufman, MD   Other Instructions NONE     Signed, Freada Bergeron, MD  01/12/2021 12:23 PM    [place]

## 2021-01-12 ENCOUNTER — Other Ambulatory Visit: Payer: Self-pay

## 2021-01-12 ENCOUNTER — Ambulatory Visit: Payer: Medicare HMO | Admitting: Cardiology

## 2021-01-12 ENCOUNTER — Encounter: Payer: Self-pay | Admitting: Cardiology

## 2021-01-12 VITALS — BP 122/64 | HR 79 | Ht 63.0 in | Wt 158.0 lb

## 2021-01-12 DIAGNOSIS — I10 Essential (primary) hypertension: Secondary | ICD-10-CM | POA: Diagnosis not present

## 2021-01-12 DIAGNOSIS — I5032 Chronic diastolic (congestive) heart failure: Secondary | ICD-10-CM

## 2021-01-12 DIAGNOSIS — I351 Nonrheumatic aortic (valve) insufficiency: Secondary | ICD-10-CM

## 2021-01-12 DIAGNOSIS — I4821 Permanent atrial fibrillation: Secondary | ICD-10-CM | POA: Diagnosis not present

## 2021-01-12 DIAGNOSIS — E782 Mixed hyperlipidemia: Secondary | ICD-10-CM | POA: Diagnosis not present

## 2021-01-12 DIAGNOSIS — E785 Hyperlipidemia, unspecified: Secondary | ICD-10-CM

## 2021-01-12 MED ORDER — METOPROLOL SUCCINATE ER 50 MG PO TB24: 75.0000 mg | ORAL_TABLET | Freq: Every day | ORAL | 3 refills | Status: DC

## 2021-01-12 NOTE — Patient Instructions (Signed)
Medication Instructions:  NO CHANGES *If you need a refill on your cardiac medications before your next appointment, please call your pharmacy*   Lab Work: NONE If you have labs (blood work) drawn today and your tests are completely normal, you will receive your results only by: Marland Kitchen MyChart Message (if you have MyChart) OR . A paper copy in the mail If you have any lab test that is abnormal or we need to change your treatment, we will call you to review the results.   Testing/Procedures: NONE   Follow-Up: At [facility], you and your health needs are our priority.  As part of our continuing mission to provide you with exceptional heart care, we have created designated Provider Care Teams.  These Care Teams include your primary Cardiologist (physician) and Advanced Practice Providers (APPs -  Physician Assistants and Nurse Practitioners) who all work together to provide you with the care you need, when you need it.  We recommend signing up for the patient portal called "MyChart".  Sign up information is provided on this After Visit Summary.  MyChart is used to connect with patients for Virtual Visits (Telemedicine).  Patients are able to view lab/test results, encounter notes, upcoming appointments, etc.  Non-urgent messages can be sent to your provider as well.   To learn more about what you can do with MyChart, go to [email or web address].    Your next appointment:   1 year(s)  The format for your next appointment:   In Person  Provider:   Gwyndolyn Kaufman, MD   Other Instructions NONE

## 2021-01-23 ENCOUNTER — Other Ambulatory Visit: Payer: Self-pay | Admitting: Cardiology

## 2021-01-23 DIAGNOSIS — I119 Hypertensive heart disease without heart failure: Secondary | ICD-10-CM

## 2021-01-23 DIAGNOSIS — I351 Nonrheumatic aortic (valve) insufficiency: Secondary | ICD-10-CM

## 2021-01-23 DIAGNOSIS — Z5181 Encounter for therapeutic drug level monitoring: Secondary | ICD-10-CM

## 2021-01-23 DIAGNOSIS — Z7901 Long term (current) use of anticoagulants: Secondary | ICD-10-CM

## 2021-01-23 DIAGNOSIS — E785 Hyperlipidemia, unspecified: Secondary | ICD-10-CM

## 2021-01-26 ENCOUNTER — Other Ambulatory Visit: Payer: Self-pay

## 2021-01-26 DIAGNOSIS — Z5181 Encounter for therapeutic drug level monitoring: Secondary | ICD-10-CM

## 2021-01-26 DIAGNOSIS — Z7901 Long term (current) use of anticoagulants: Secondary | ICD-10-CM

## 2021-01-26 DIAGNOSIS — I351 Nonrheumatic aortic (valve) insufficiency: Secondary | ICD-10-CM

## 2021-01-26 DIAGNOSIS — E785 Hyperlipidemia, unspecified: Secondary | ICD-10-CM

## 2021-01-26 DIAGNOSIS — I119 Hypertensive heart disease without heart failure: Secondary | ICD-10-CM

## 2021-01-26 MED ORDER — ROSUVASTATIN CALCIUM 10 MG PO TABS: 10.0000 mg | ORAL_TABLET | Freq: Every day | ORAL | 3 refills | Status: DC

## 2021-01-26 NOTE — Telephone Encounter (Signed)
Pt's medication was sent to pt's pharmacy as requested. Confirmation received.  °

## 2021-02-07 ENCOUNTER — Other Ambulatory Visit: Payer: Self-pay | Admitting: Cardiology

## 2021-02-09 NOTE — Telephone Encounter (Signed)
Eliquis 5mg  refill request received. Patient is 80 years old, weight-71.7kg, Crea-0.77 on 09/23/2020 via [facility] from [place], [place], and last seen by Dr. Johney Frame on 01/12/21. Dose is appropriate based on dosing criteria. Will send in refill to requested pharmacy.

## 2021-03-11 DIAGNOSIS — I10 Essential (primary) hypertension: Secondary | ICD-10-CM | POA: Diagnosis not present

## 2021-03-11 DIAGNOSIS — J45909 Unspecified asthma, uncomplicated: Secondary | ICD-10-CM | POA: Diagnosis not present

## 2021-03-11 DIAGNOSIS — I4891 Unspecified atrial fibrillation: Secondary | ICD-10-CM | POA: Diagnosis not present

## 2021-03-11 DIAGNOSIS — M810 Age-related osteoporosis without current pathological fracture: Secondary | ICD-10-CM | POA: Diagnosis not present

## 2021-03-11 DIAGNOSIS — I119 Hypertensive heart disease without heart failure: Secondary | ICD-10-CM | POA: Diagnosis not present

## 2021-03-11 DIAGNOSIS — E782 Mixed hyperlipidemia: Secondary | ICD-10-CM | POA: Diagnosis not present

## 2021-03-20 ENCOUNTER — Other Ambulatory Visit: Payer: Self-pay

## 2021-03-20 MED ORDER — DILTIAZEM HCL ER COATED BEADS 120 MG PO CP24: 120.0000 mg | ORAL_CAPSULE | Freq: Every day | ORAL | 3 refills | Status: DC

## 2021-04-16 DIAGNOSIS — N200 Calculus of kidney: Secondary | ICD-10-CM | POA: Diagnosis not present

## 2021-06-18 DIAGNOSIS — E782 Mixed hyperlipidemia: Secondary | ICD-10-CM | POA: Diagnosis not present

## 2021-06-18 DIAGNOSIS — I10 Essential (primary) hypertension: Secondary | ICD-10-CM | POA: Diagnosis not present

## 2021-06-18 DIAGNOSIS — M810 Age-related osteoporosis without current pathological fracture: Secondary | ICD-10-CM | POA: Diagnosis not present

## 2021-06-18 DIAGNOSIS — R7301 Impaired fasting glucose: Secondary | ICD-10-CM | POA: Diagnosis not present

## 2021-06-18 DIAGNOSIS — R7303 Prediabetes: Secondary | ICD-10-CM | POA: Diagnosis not present

## 2021-06-18 DIAGNOSIS — Z0000 Encounter for general adult medical examination without abnormal findings: Secondary | ICD-10-CM | POA: Diagnosis not present

## 2021-07-22 DIAGNOSIS — H25813 Combined forms of age-related cataract, bilateral: Secondary | ICD-10-CM | POA: Diagnosis not present

## 2021-07-22 DIAGNOSIS — H353131 Nonexudative age-related macular degeneration, bilateral, early dry stage: Secondary | ICD-10-CM | POA: Diagnosis not present

## 2021-08-10 ENCOUNTER — Other Ambulatory Visit: Payer: Self-pay | Admitting: *Deleted

## 2021-08-10 DIAGNOSIS — I4821 Permanent atrial fibrillation: Secondary | ICD-10-CM

## 2021-08-10 MED ORDER — APIXABAN 5 MG PO TABS: 5.0000 mg | ORAL_TABLET | Freq: Two times a day (BID) | ORAL | 1 refills | Status: DC

## 2021-08-10 NOTE — Telephone Encounter (Signed)
Eliquis 5mg  paper refill request received. Patient is 80 years old, weight-71.7kg, Crea-0.76 on 06/18/2021 via [place] from [place], [place], and last seen by Dr. Johney Frame on 01/12/2021. Dose is appropriate based on dosing criteria. Will send in refill to requested pharmacy.

## 2021-09-10 ENCOUNTER — Ambulatory Visit (INDEPENDENT_AMBULATORY_CARE_PROVIDER_SITE_OTHER): Payer: Medicare HMO

## 2021-09-10 ENCOUNTER — Ambulatory Visit: Payer: Medicare HMO | Admitting: Podiatry

## 2021-09-10 ENCOUNTER — Other Ambulatory Visit: Payer: Self-pay

## 2021-09-10 DIAGNOSIS — M779 Enthesopathy, unspecified: Secondary | ICD-10-CM

## 2021-09-10 DIAGNOSIS — M778 Other enthesopathies, not elsewhere classified: Secondary | ICD-10-CM | POA: Diagnosis not present

## 2021-09-10 MED ORDER — TRIAMCINOLONE ACETONIDE 10 MG/ML IJ SUSP
20.0000 mg | Freq: Once | INTRAMUSCULAR | Status: AC
Start: 2021-09-10 — End: 2021-09-10
  Administered 2021-09-10: 15:00:00 20 mg

## 2021-09-11 NOTE — Progress Notes (Signed)
Subjective:   Patient ID: Kathryn Chang, female   DOB: 80 y.o.   MRN: [id number]   HPI Patient presents with pain on the dorsum of both feet stating that she is done well for the last year but she needs medication   ROS      Objective:  Physical Exam  Neurovascular status intact with what appears to be arthritis with inflammation of the metatarsocuneiform joints bilateral with fluid buildup and swelling     Assessment:  Chronic metatarsocuneiform arthritis with inflammatory inflammation of the joint surfaces     Plan:  H&P x-rays reviewed today sterile prep and injected the midfoot bilateral around the metatarsocuneiform joints in a periarticular fashion 3 mg Kenalog 5 mg Xylocaine and advised on wider shoe gear reduced activity for the next few weeks  X-rays indicate that there is arthritis occurring in the joints and not significant change from previous x-rays

## 2021-09-28 DIAGNOSIS — H2513 Age-related nuclear cataract, bilateral: Secondary | ICD-10-CM | POA: Diagnosis not present

## 2021-09-28 DIAGNOSIS — H353112 Nonexudative age-related macular degeneration, right eye, intermediate dry stage: Secondary | ICD-10-CM | POA: Diagnosis not present

## 2021-09-28 DIAGNOSIS — H3581 Retinal edema: Secondary | ICD-10-CM | POA: Diagnosis not present

## 2021-09-28 DIAGNOSIS — H353221 Exudative age-related macular degeneration, left eye, with active choroidal neovascularization: Secondary | ICD-10-CM | POA: Diagnosis not present

## 2021-10-05 DIAGNOSIS — H353112 Nonexudative age-related macular degeneration, right eye, intermediate dry stage: Secondary | ICD-10-CM | POA: Diagnosis not present

## 2021-10-05 DIAGNOSIS — H353221 Exudative age-related macular degeneration, left eye, with active choroidal neovascularization: Secondary | ICD-10-CM | POA: Diagnosis not present

## 2021-10-05 DIAGNOSIS — H3581 Retinal edema: Secondary | ICD-10-CM | POA: Diagnosis not present

## 2021-10-07 DIAGNOSIS — M810 Age-related osteoporosis without current pathological fracture: Secondary | ICD-10-CM | POA: Diagnosis not present

## 2021-10-07 DIAGNOSIS — I119 Hypertensive heart disease without heart failure: Secondary | ICD-10-CM | POA: Diagnosis not present

## 2021-10-07 DIAGNOSIS — J45909 Unspecified asthma, uncomplicated: Secondary | ICD-10-CM | POA: Diagnosis not present

## 2021-10-07 DIAGNOSIS — I10 Essential (primary) hypertension: Secondary | ICD-10-CM | POA: Diagnosis not present

## 2021-10-07 DIAGNOSIS — I4891 Unspecified atrial fibrillation: Secondary | ICD-10-CM | POA: Diagnosis not present

## 2021-10-07 DIAGNOSIS — E782 Mixed hyperlipidemia: Secondary | ICD-10-CM | POA: Diagnosis not present

## 2021-10-12 DIAGNOSIS — H353112 Nonexudative age-related macular degeneration, right eye, intermediate dry stage: Secondary | ICD-10-CM | POA: Diagnosis not present

## 2021-10-12 DIAGNOSIS — H353221 Exudative age-related macular degeneration, left eye, with active choroidal neovascularization: Secondary | ICD-10-CM | POA: Diagnosis not present

## 2021-10-12 DIAGNOSIS — H3581 Retinal edema: Secondary | ICD-10-CM | POA: Diagnosis not present

## 2021-10-22 DIAGNOSIS — M8589 Other specified disorders of bone density and structure, multiple sites: Secondary | ICD-10-CM | POA: Diagnosis not present

## 2021-10-22 DIAGNOSIS — Z780 Asymptomatic menopausal state: Secondary | ICD-10-CM | POA: Diagnosis not present

## 2021-10-29 DIAGNOSIS — Z1231 Encounter for screening mammogram for malignant neoplasm of breast: Secondary | ICD-10-CM | POA: Diagnosis not present

## 2021-10-29 DIAGNOSIS — N200 Calculus of kidney: Secondary | ICD-10-CM | POA: Diagnosis not present

## 2021-11-17 DIAGNOSIS — H3581 Retinal edema: Secondary | ICD-10-CM | POA: Diagnosis not present

## 2021-11-17 DIAGNOSIS — H353112 Nonexudative age-related macular degeneration, right eye, intermediate dry stage: Secondary | ICD-10-CM | POA: Diagnosis not present

## 2021-11-17 DIAGNOSIS — H353221 Exudative age-related macular degeneration, left eye, with active choroidal neovascularization: Secondary | ICD-10-CM | POA: Diagnosis not present

## 2021-12-22 DIAGNOSIS — H353221 Exudative age-related macular degeneration, left eye, with active choroidal neovascularization: Secondary | ICD-10-CM | POA: Diagnosis not present

## 2021-12-22 DIAGNOSIS — H353112 Nonexudative age-related macular degeneration, right eye, intermediate dry stage: Secondary | ICD-10-CM | POA: Diagnosis not present

## 2021-12-25 ENCOUNTER — Other Ambulatory Visit: Payer: Self-pay | Admitting: *Deleted

## 2021-12-25 DIAGNOSIS — I4821 Permanent atrial fibrillation: Secondary | ICD-10-CM

## 2021-12-25 DIAGNOSIS — I5032 Chronic diastolic (congestive) heart failure: Secondary | ICD-10-CM

## 2021-12-25 MED ORDER — METOPROLOL SUCCINATE ER 50 MG PO TB24: 75.0000 mg | ORAL_TABLET | Freq: Every day | ORAL | 3 refills | Status: DC

## 2022-01-12 NOTE — Progress Notes (Signed)
?Cardiology Office Note:   ? ?Date:  01/15/2022  ? ?ID:  Kathryn Chang, DOB 06-Jun-1941, MRN [id number] ? ?PCP:  [name], MD ?  ?[place]  ?Cardiologist:  Ena Dawley, MD  ?Advanced Practice Provider:  No care team member to display ?Electrophysiologist:  None  ? ? ?Referring MD: [name], MD  ? ? ? ?History of Present Illness:   ? ?Kathryn Chang is a 81 y.o. female with a hx of permanent atrial fibrillation on long-term anticoagulation with Apixaban, chronic diastolic heart failure, and aortic insufficiency who was previously followed by Dr. [name] who now presents to clinic for follow-up. ? ?She was admitted in February 2019 with influenza complicated by pneumonia and sepsis as well as a atrial fibrillation with rapid ventricular rate and volume overload. She was treated with IV Lasix with improvement. ? ?Last seen 12/2020 where she was doing well. No palpitations. Remained active. ? ?Today, the patient overall feels well. No palpitations and she is asymptomatic with Afib. No chest pain, SOB, lightheadedness. She does an exercise class 2x/week and walks as well. Compliant with her medications. Blood pressure well controlled. Cholesterol is well controlled.  ? ?Past Medical History:  ?Diagnosis Date  ? Aortic atherosclerosis ([place])   ? CT 11/2017  ? Arthritis   ? "hands, feet" (12/08/2017)  ? Breast cancer, left breast ([place]) 1987  ? Permanent atrial fibrillation ([place])   ? Pneumonia   ? "couple times" (12/08/2017)  ? SBE (subacute bacterial endocarditis) prophylaxis candidate   ? for MR, AI  ? ? ?Past Surgical History:  ?Procedure Laterality Date  ? APPENDECTOMY    ? BREAST BIOPSY Left 1987  ? MASTECTOMY Left 1987  ? With immediate Reconstruction  ? TUBAL LIGATION    ? US ECHOCARDIOGRAPHY  10/2010  ? EF- 55-60%, mild  MR, mild AR  ? ? ?Current Medications: ?Current Meds  ?Medication Sig  ? acetaminophen (TYLENOL) 500 MG tablet Take 1,000 mg by mouth every 8 (eight)  hours as needed for mild pain.   ? apixaban (ELIQUIS) 5 MG TABS tablet Take 1 tablet (5 mg total) by mouth 2 (two) times daily.  ? calcium carbonate (OS-CAL) 600 MG TABS tablet   ? Cholecalciferol (VITAMIN D3 PO) 1,000 Units.  ? diltiazem (CARDIZEM CD) 120 MG 24 hr capsule Take 1 capsule (120 mg total) by mouth daily.  ? EPINEPHrine 0.3 mg/0.3 mL IJ SOAJ injection Inject 0.3 mg into the muscle as needed for anaphylaxis.  ? metoprolol succinate (TOPROL-XL) 50 MG 24 hr tablet Take 1.5 tablets (75 mg total) by mouth daily.  ? Multiple Vitamins-Minerals (MULTIVITAMIN WITH MINERALS) tablet Take 1 tablet by mouth.  ? Multiple Vitamins-Minerals (PRESERVISION AREDS 2) CAPS Take by mouth. 2 capsules daily  ? rosuvastatin (CRESTOR) 10 MG tablet Take 1 tablet (10 mg total) by mouth daily.  ?  ? ?Allergies:   Shellfish allergy, Shellfish-derived products, Tamiflu [oseltamivir phosphate], Iodinated contrast media, Oseltamivir, and Sulfamethoxazole-trimethoprim  ? ?Social History  ? ?Socioeconomic History  ? Marital status: Married  ?  Spouse name: Not on file  ? Number of children: Not on file  ? Years of education: Not on file  ? Highest education level: Not on file  ?Occupational History  ? Not on file  ?Tobacco Use  ? Smoking status: Never  ? Smokeless tobacco: Never  ?Vaping Use  ? Vaping Use: Never used  ?Substance and Sexual Activity  ? Alcohol use: Yes  ?  Alcohol/week:  2.0 standard drinks  ?  Types: 2 Glasses of wine per week  ? Drug use: No  ? Sexual activity: Yes  ?Other Topics Concern  ? Not on file  ?Social History Narrative  ? Not on file  ? ?Social Determinants of Health  ? ?Financial Resource Strain: Not on file  ?Food Insecurity: Not on file  ?Transportation Needs: Not on file  ?Physical Activity: Not on file  ?Stress: Not on file  ?Social Connections: Not on file  ?  ? ?Family History: ?The patient's family history includes Heart disease in her father and mother; Hypertension in her father and mother. ? ?ROS:    ?Please see the history of present illness.    ?Review of Systems  ?Constitutional:  Negative for chills, fever and malaise/fatigue.  ?HENT:  Negative for hearing loss and sore throat.   ?Eyes:  Negative for blurred vision and redness.  ?Respiratory:  Negative for shortness of breath.   ?Cardiovascular:  Negative for chest pain, palpitations, orthopnea, claudication, leg swelling and PND.  ?Gastrointestinal:  Negative for melena, nausea and vomiting.  ?Genitourinary:  Negative for dysuria and frequency.  ?Musculoskeletal:  Negative for falls and myalgias.  ?Neurological:  Negative for dizziness and loss of consciousness.  ?Endo/Heme/Allergies:  Negative for polydipsia.  ?Psychiatric/Behavioral:  Negative for substance abuse.   ? ?EKGs/Labs/Other Studies Reviewed:   ? ?The following studies were reviewed today: ?Echo 12/09/17 ?Diffuse HK worse in the mid and basal inferior wall, EF 50-55, mild to moderate AI, mild MR, moderate LAE, moderate RAE, PASP 47 ?  ?Echo 03/02/17 ?EF 55-60, normal wall motion, mild AI, ascending aorta 38 (mildly dilated), mild MR, severe LAE, mild RAE, mild TR, PASP 32 ?  ?Echo 01/01/15 ?EF 55-60, normal wall motion, moderate AI, severe LAE, moderate RAE, PASP 42 ? ?EKG:  EKG is  ordered today.  The ekg ordered today demonstrates Afib with HR 82 ? ?Recent Labs: ?No results found for requested labs within last 8760 hours.  ?Recent Lipid Panel ?   ?Component Value Date/Time  ? CHOL 121 02/11/2020 0913  ? TRIG 109 02/11/2020 0913  ? HDL 59 02/11/2020 0913  ? CHOLHDL 2.1 02/11/2020 0913  ? [place] 42 02/11/2020 0913  ? ? ? ?Risk Assessment/Calculations:   ? ?CHA2DS2-VASc Score =    ?This indicates a  % annual risk of stroke. ?The patient's score is based upon: ?  ? ? ?Physical Exam:   ? ?VS:  BP 124/76   Pulse 82   Ht '5\' 3"'$  (1.6 m)   Wt 164 lb (74.4 kg)   SpO2 97%   BMI 29.05 kg/m?    ? ?Wt Readings from Last 3 Encounters:  ?01/15/22 164 lb (74.4 kg)  ?01/12/21 158 lb (71.7 kg)  ?10/21/20 155 lb  (70.3 kg)  ?  ? ?GEN:  Well nourished, well developed in no acute distress ?HEENT: Normal ?NECK: No JVD; No carotid bruits ?CARDIAC: Irregularly irregular, no murmurs, rubs, gallops ?RESPIRATORY:  Clear to auscultation without rales, wheezing or rhonchi  ?ABDOMEN: Soft, non-tender, non-distended ?MUSCULOSKELETAL:  No edema; No deformity  ?SKIN: Warm and dry ?NEUROLOGIC:  Alert and oriented x 3 ?PSYCHIATRIC:  Normal affect  ? ?ASSESSMENT:   ? ?1. Permanent atrial fibrillation ([place])   ?2. Aortic valve insufficiency, etiology of cardiac valve disease unspecified   ?3. Chronic diastolic (congestive) heart failure ([facility])   ?4. Mixed hyperlipidemia   ?5. Acquired thrombophilia ([place])   ? ?PLAN:   ? ?In order of problems  listed above: ? ?#Chronic diastolic CHF ?Had initial acute decompensated episode of volume overload in the setting FluA, RSV with sepsis and pneumonia. Has been stable since that time with no recurrence of symptoms. ?-No recurrent symptoms, no diuretics needed. ?  ?# Permanent Afib: ?In persistent a-fib -rate controlled, she is completely asymptomatic, ?-CHADS-VASc 5, on chronic therapy with Eliquis ?-Continue diltiazem '120mg'$  daily ?-Continue metop '75mg'$  XL daily ? ?# Mild to moderate aortic insufficiency by echocardiogram: ?-Stable on TTE 02/11/2020 ?-Will repeat TTE for monioting ?  ?# Hypertension - well controlled. ?-Continue dilt and metop as above ?  ?# Hyperlipidemia: ?LDL well controlled at 52. ?-Continue crestor '10mg'$  daily ? ? ? ?Medication Adjustments/Labs and Tests Ordered: ?Current medicines are reviewed at length with the patient today.  Concerns regarding medicines are outlined above.  ?Orders Placed This Encounter  ?Procedures  ? EKG 12-Lead  ? ECHOCARDIOGRAM COMPLETE  ? ?No orders of the defined types were placed in this encounter. ? ? ?Patient Instructions  ?Medication Instructions:  ? ?Your physician recommends that you continue on your current medications as directed. Please refer to the  Current Medication list given to you today. ? ?*If you need a refill on your cardiac medications before your next appointment, please call your pharmacy* ? ? ? ?Testing/Procedures: ? ?Your physician has req

## 2022-01-15 ENCOUNTER — Other Ambulatory Visit: Payer: Self-pay

## 2022-01-15 ENCOUNTER — Encounter: Payer: Self-pay | Admitting: Cardiology

## 2022-01-15 ENCOUNTER — Ambulatory Visit: Payer: Medicare HMO | Admitting: Cardiology

## 2022-01-15 VITALS — BP 124/76 | HR 82 | Ht 63.0 in | Wt 164.0 lb

## 2022-01-15 DIAGNOSIS — D6869 Other thrombophilia: Secondary | ICD-10-CM

## 2022-01-15 DIAGNOSIS — E782 Mixed hyperlipidemia: Secondary | ICD-10-CM | POA: Diagnosis not present

## 2022-01-15 DIAGNOSIS — I351 Nonrheumatic aortic (valve) insufficiency: Secondary | ICD-10-CM

## 2022-01-15 DIAGNOSIS — I5032 Chronic diastolic (congestive) heart failure: Secondary | ICD-10-CM | POA: Diagnosis not present

## 2022-01-15 DIAGNOSIS — I4821 Permanent atrial fibrillation: Secondary | ICD-10-CM

## 2022-01-15 NOTE — Patient Instructions (Signed)
Medication Instructions:   Your physician recommends that you continue on your current medications as directed. Please refer to the Current Medication list given to you today.  *If you need a refill on your cardiac medications before your next appointment, please call your pharmacy*   Testing/Procedures:  Your physician has requested that you have an echocardiogram. Echocardiography is a painless test that uses sound waves to create images of your heart. It provides your doctor with information about the size and shape of your heart and how well your heart's chambers and valves are working. This procedure takes approximately one hour. There are no restrictions for this procedure.   Follow-Up: At [facility], you and your health needs are our priority.  As part of our continuing mission to provide you with exceptional heart care, we have created designated Provider Care Teams.  These Care Teams include your primary Cardiologist (physician) and Advanced Practice Providers (APPs -  Physician Assistants and Nurse Practitioners) who all work together to provide you with the care you need, when you need it.  We recommend signing up for the patient portal called "MyChart".  Sign up information is provided on this After Visit Summary.  MyChart is used to connect with patients for Virtual Visits (Telemedicine).  Patients are able to view lab/test results, encounter notes, upcoming appointments, etc.  Non-urgent messages can be sent to your provider as well.   To learn more about what you can do with MyChart, go to [email or web address].    Your next appointment:   1 year(s)  The format for your next appointment:   In Person  Provider:   DR. P[place]ERTON          

## 2022-01-19 DIAGNOSIS — H353221 Exudative age-related macular degeneration, left eye, with active choroidal neovascularization: Secondary | ICD-10-CM | POA: Diagnosis not present

## 2022-02-01 ENCOUNTER — Other Ambulatory Visit: Payer: Self-pay

## 2022-02-01 DIAGNOSIS — Z5181 Encounter for therapeutic drug level monitoring: Secondary | ICD-10-CM

## 2022-02-01 DIAGNOSIS — Z7901 Long term (current) use of anticoagulants: Secondary | ICD-10-CM

## 2022-02-01 DIAGNOSIS — I119 Hypertensive heart disease without heart failure: Secondary | ICD-10-CM

## 2022-02-01 DIAGNOSIS — E785 Hyperlipidemia, unspecified: Secondary | ICD-10-CM

## 2022-02-01 DIAGNOSIS — I351 Nonrheumatic aortic (valve) insufficiency: Secondary | ICD-10-CM

## 2022-02-01 MED ORDER — ROSUVASTATIN CALCIUM 10 MG PO TABS: 10.0000 mg | ORAL_TABLET | Freq: Every day | ORAL | 3 refills | Status: DC

## 2022-02-03 ENCOUNTER — Ambulatory Visit (HOSPITAL_COMMUNITY): Payer: Medicare HMO | Attending: Cardiovascular Disease

## 2022-02-03 DIAGNOSIS — I351 Nonrheumatic aortic (valve) insufficiency: Secondary | ICD-10-CM | POA: Diagnosis not present

## 2022-02-03 LAB — ECHOCARDIOGRAM COMPLETE
Area-P 1/2: 3.81 cm2
P 1/2 time: 492 msec
S' Lateral: 2.5 cm

## 2022-02-04 ENCOUNTER — Other Ambulatory Visit: Payer: Self-pay

## 2022-02-04 DIAGNOSIS — I4821 Permanent atrial fibrillation: Secondary | ICD-10-CM

## 2022-02-04 MED ORDER — APIXABAN 5 MG PO TABS: 5.0000 mg | ORAL_TABLET | Freq: Two times a day (BID) | ORAL | 1 refills | Status: DC

## 2022-02-04 NOTE — Telephone Encounter (Signed)
Prescription refill request for Eliquis received. ?Indication: Afib  ?Last office visit: 01/15/22 Johney Frame) ?Scr:0.76 (06/18/21 via [place]) ?Age: 81 ?Weight: 74.4kg ? ?Appropriate dose and refill sent to requested pharmacy.  ? ? ?

## 2022-03-02 DIAGNOSIS — H353112 Nonexudative age-related macular degeneration, right eye, intermediate dry stage: Secondary | ICD-10-CM | POA: Diagnosis not present

## 2022-03-02 DIAGNOSIS — H353221 Exudative age-related macular degeneration, left eye, with active choroidal neovascularization: Secondary | ICD-10-CM | POA: Diagnosis not present

## 2022-04-01 ENCOUNTER — Other Ambulatory Visit: Payer: Self-pay

## 2022-04-01 MED ORDER — DILTIAZEM HCL ER COATED BEADS 120 MG PO CP24: 120.0000 mg | ORAL_CAPSULE | Freq: Every day | ORAL | 3 refills | Status: DC

## 2022-05-04 DIAGNOSIS — H353221 Exudative age-related macular degeneration, left eye, with active choroidal neovascularization: Secondary | ICD-10-CM | POA: Diagnosis not present

## 2022-05-04 DIAGNOSIS — H353112 Nonexudative age-related macular degeneration, right eye, intermediate dry stage: Secondary | ICD-10-CM | POA: Diagnosis not present

## 2022-05-13 DIAGNOSIS — N200 Calculus of kidney: Secondary | ICD-10-CM | POA: Diagnosis not present

## 2022-06-09 DIAGNOSIS — I10 Essential (primary) hypertension: Secondary | ICD-10-CM | POA: Diagnosis not present

## 2022-06-09 DIAGNOSIS — I119 Hypertensive heart disease without heart failure: Secondary | ICD-10-CM | POA: Diagnosis not present

## 2022-06-09 DIAGNOSIS — M810 Age-related osteoporosis without current pathological fracture: Secondary | ICD-10-CM | POA: Diagnosis not present

## 2022-06-09 DIAGNOSIS — E782 Mixed hyperlipidemia: Secondary | ICD-10-CM | POA: Diagnosis not present

## 2022-06-23 DIAGNOSIS — Z1389 Encounter for screening for other disorder: Secondary | ICD-10-CM | POA: Diagnosis not present

## 2022-06-23 DIAGNOSIS — Z23 Encounter for immunization: Secondary | ICD-10-CM | POA: Diagnosis not present

## 2022-06-23 DIAGNOSIS — Z0000 Encounter for general adult medical examination without abnormal findings: Secondary | ICD-10-CM | POA: Diagnosis not present

## 2022-07-05 DIAGNOSIS — Z23 Encounter for immunization: Secondary | ICD-10-CM | POA: Diagnosis not present

## 2022-07-05 DIAGNOSIS — I119 Hypertensive heart disease without heart failure: Secondary | ICD-10-CM | POA: Diagnosis not present

## 2022-07-05 DIAGNOSIS — R7303 Prediabetes: Secondary | ICD-10-CM | POA: Diagnosis not present

## 2022-07-05 DIAGNOSIS — I700 Atherosclerosis of aorta: Secondary | ICD-10-CM | POA: Diagnosis not present

## 2022-07-05 DIAGNOSIS — Z7901 Long term (current) use of anticoagulants: Secondary | ICD-10-CM | POA: Diagnosis not present

## 2022-07-05 DIAGNOSIS — D6869 Other thrombophilia: Secondary | ICD-10-CM | POA: Diagnosis not present

## 2022-07-05 DIAGNOSIS — I4891 Unspecified atrial fibrillation: Secondary | ICD-10-CM | POA: Diagnosis not present

## 2022-07-05 DIAGNOSIS — E782 Mixed hyperlipidemia: Secondary | ICD-10-CM | POA: Diagnosis not present

## 2022-07-05 DIAGNOSIS — Z79899 Other long term (current) drug therapy: Secondary | ICD-10-CM | POA: Diagnosis not present

## 2022-07-06 DIAGNOSIS — H353112 Nonexudative age-related macular degeneration, right eye, intermediate dry stage: Secondary | ICD-10-CM | POA: Diagnosis not present

## 2022-07-06 DIAGNOSIS — H2513 Age-related nuclear cataract, bilateral: Secondary | ICD-10-CM | POA: Diagnosis not present

## 2022-07-06 DIAGNOSIS — H353221 Exudative age-related macular degeneration, left eye, with active choroidal neovascularization: Secondary | ICD-10-CM | POA: Diagnosis not present

## 2022-07-27 ENCOUNTER — Other Ambulatory Visit: Payer: Self-pay

## 2022-07-27 DIAGNOSIS — I4821 Permanent atrial fibrillation: Secondary | ICD-10-CM

## 2022-07-27 MED ORDER — APIXABAN 5 MG PO TABS: 5.0000 mg | ORAL_TABLET | Freq: Two times a day (BID) | ORAL | 1 refills | Status: DC

## 2022-07-27 NOTE — Telephone Encounter (Signed)
Prescription refill request for Eliquis received. Indication:Afib Last office visit:3/23 Scr:0.7 Age: 81 Weight:74.4 kg  Prescription refilled

## 2022-09-20 ENCOUNTER — Ambulatory Visit: Payer: Medicare HMO | Admitting: Podiatry

## 2022-09-20 ENCOUNTER — Ambulatory Visit (INDEPENDENT_AMBULATORY_CARE_PROVIDER_SITE_OTHER): Payer: Medicare HMO

## 2022-09-20 ENCOUNTER — Encounter: Payer: Self-pay | Admitting: Podiatry

## 2022-09-20 DIAGNOSIS — D689 Coagulation defect, unspecified: Secondary | ICD-10-CM

## 2022-09-20 DIAGNOSIS — L84 Corns and callosities: Secondary | ICD-10-CM | POA: Diagnosis not present

## 2022-09-20 DIAGNOSIS — M779 Enthesopathy, unspecified: Secondary | ICD-10-CM | POA: Diagnosis not present

## 2022-09-20 MED ORDER — TRIAMCINOLONE ACETONIDE 10 MG/ML IJ SUSP
20.0000 mg | Freq: Once | INTRAMUSCULAR | Status: AC
  Administered 2022-09-20: 20 mg

## 2022-09-21 DIAGNOSIS — H2513 Age-related nuclear cataract, bilateral: Secondary | ICD-10-CM | POA: Diagnosis not present

## 2022-09-21 DIAGNOSIS — H353221 Exudative age-related macular degeneration, left eye, with active choroidal neovascularization: Secondary | ICD-10-CM | POA: Diagnosis not present

## 2022-09-21 DIAGNOSIS — H353112 Nonexudative age-related macular degeneration, right eye, intermediate dry stage: Secondary | ICD-10-CM | POA: Diagnosis not present

## 2022-09-21 NOTE — Progress Notes (Signed)
Subjective:   Patient ID: Kathryn Chang, female   DOB: 81 y.o.   MRN: [id number]   HPI Patient states she is developed pain on top of both feet and does have a mild swelling of the second digit right   ROS      Objective:  Physical Exam  Neuro vascular status intact muscle strength adequate range of motion is adequate with patient found to have inflammation pain of the extensor complex across the midtarsal joint bilateral right over left inflammation fluid and mild swelling second digit right     Assessment:  Probability for extensor tendinitis with midfoot arthritis bilateral     Plan:  H&P reviewed condition sterile prep and injected the tendon complex bilateral left explaining risk 3 mg Dexasone Kenalog 5 mg Xylocaine advised on heat ice therapy reappoint as symptoms indicate  X-rays do indicate arthritis across the midtarsal joint right and left foot with no other pathology noted with mild arthritis possible of the second digit right

## 2022-11-04 DIAGNOSIS — Z1231 Encounter for screening mammogram for malignant neoplasm of breast: Secondary | ICD-10-CM | POA: Diagnosis not present

## 2022-12-07 DIAGNOSIS — H353221 Exudative age-related macular degeneration, left eye, with active choroidal neovascularization: Secondary | ICD-10-CM | POA: Diagnosis not present

## 2022-12-07 DIAGNOSIS — H353112 Nonexudative age-related macular degeneration, right eye, intermediate dry stage: Secondary | ICD-10-CM | POA: Diagnosis not present

## 2022-12-07 DIAGNOSIS — H2513 Age-related nuclear cataract, bilateral: Secondary | ICD-10-CM | POA: Diagnosis not present

## 2023-01-26 ENCOUNTER — Other Ambulatory Visit: Payer: Self-pay | Admitting: *Deleted

## 2023-01-26 DIAGNOSIS — Z5181 Encounter for therapeutic drug level monitoring: Secondary | ICD-10-CM

## 2023-01-26 DIAGNOSIS — E785 Hyperlipidemia, unspecified: Secondary | ICD-10-CM

## 2023-01-26 DIAGNOSIS — I351 Nonrheumatic aortic (valve) insufficiency: Secondary | ICD-10-CM

## 2023-01-26 DIAGNOSIS — Z7901 Long term (current) use of anticoagulants: Secondary | ICD-10-CM

## 2023-01-26 DIAGNOSIS — I5032 Chronic diastolic (congestive) heart failure: Secondary | ICD-10-CM

## 2023-01-26 DIAGNOSIS — I119 Hypertensive heart disease without heart failure: Secondary | ICD-10-CM

## 2023-01-26 DIAGNOSIS — I4821 Permanent atrial fibrillation: Secondary | ICD-10-CM

## 2023-01-26 MED ORDER — ROSUVASTATIN CALCIUM 10 MG PO TABS: 10.0000 mg | ORAL_TABLET | Freq: Every day | ORAL | 0 refills | Status: DC

## 2023-01-26 MED ORDER — METOPROLOL SUCCINATE ER 50 MG PO TB24: 75.0000 mg | ORAL_TABLET | Freq: Every day | ORAL | 0 refills | Status: DC

## 2023-01-27 ENCOUNTER — Other Ambulatory Visit: Payer: Self-pay

## 2023-01-27 DIAGNOSIS — I4821 Permanent atrial fibrillation: Secondary | ICD-10-CM

## 2023-01-27 MED ORDER — APIXABAN 5 MG PO TABS: 5.0000 mg | ORAL_TABLET | Freq: Two times a day (BID) | ORAL | 1 refills | Status: DC

## 2023-01-27 NOTE — Telephone Encounter (Signed)
Prescription refill request for Eliquis received. Indication: Afib  Last office visit: 01/15/22 Johney Frame)  Scr: 0.74 (07/05/22 via [place])  Age: 83 Weight: 74.4kg  Appropriate dose. Refill sent.

## 2023-01-31 NOTE — Progress Notes (Signed)
Cardiology Office Note:    Date:  02/07/2023   ID:  Kathryn Chang, DOB Nov 02, 1940, MRN [id number]  PCP:  Catha Gosselin, MD   [facility]  Cardiologist:  Tobias Alexander, MD  Advanced Practice Provider:  No care team member to display Electrophysiologist:  None    Referring MD: Catha Gosselin, MD     History of Present Illness:    Kathryn Chang is a 82 y.o. female with a hx of permanent atrial fibrillation on long-term anticoagulation with Apixaban, chronic diastolic heart failure, and aortic insufficiency who was previously followed by Dr. Delton See who now presents to clinic for follow-up.  She was admitted in February 2019 with influenza complicated by pneumonia and sepsis as well as a atrial fibrillation with rapid ventricular rate and volume overload. She was treated with IV Lasix with improvement.  Last seen 12/2021 where she was doing very well from a CV standpoint.  Today, the patient overall feels well. No chest pain, SOB, LE edema, orthopnea or PND. Very rare palpitations but nothing that sustains or is bothersome. Tolerating apixaban without issue.    Past Medical History:  Diagnosis Date   Aortic atherosclerosis    CT 11/2017   Arthritis    "hands, feet" (12/08/2017)   Breast cancer, left breast 1987   Permanent atrial fibrillation    Pneumonia    "couple times" (12/08/2017)   SBE (subacute bacterial endocarditis) prophylaxis candidate    for MR, AI    Past Surgical History:  Procedure Laterality Date   APPENDECTOMY     BREAST BIOPSY Left 1987   MASTECTOMY Left 1987   With immediate Reconstruction   TUBAL LIGATION     US ECHOCARDIOGRAPHY  10/2010   EF- 55-60%, mild  MR, mild AR    Current Medications: Current Meds  Medication Sig   acetaminophen (TYLENOL) 500 MG tablet Take 1,000 mg by mouth every 8 (eight) hours as needed for mild pain.    apixaban (ELIQUIS) 5 MG TABS tablet Take 1 tablet (5 mg total) by mouth 2  (two) times daily.   calcium carbonate (OS-CAL) 600 MG TABS tablet    Cholecalciferol (VITAMIN D3 PO) 1,000 Units.   diltiazem (CARDIZEM CD) 120 MG 24 hr capsule Take 1 capsule (120 mg total) by mouth daily.   EPINEPHrine 0.3 mg/0.3 mL IJ SOAJ injection Inject 0.3 mg into the muscle as needed for anaphylaxis.   metoprolol succinate (TOPROL-XL) 50 MG 24 hr tablet Take 1.5 tablets (75 mg total) by mouth daily. Please keep upcoming appointment for future refills. Thank you   Multiple Vitamins-Minerals (MULTIVITAMIN WITH MINERALS) tablet Take 1 tablet by mouth.   Multiple Vitamins-Minerals (PRESERVISION AREDS 2) CAPS Take by mouth. 2 capsules daily   rosuvastatin (CRESTOR) 10 MG tablet Take 1 tablet (10 mg total) by mouth daily.     Allergies:   Shellfish allergy, Shellfish-derived products, Tamiflu [oseltamivir phosphate], Iodinated contrast media, Oseltamivir, and Sulfamethoxazole-trimethoprim   Social History   Socioeconomic History   Marital status: Married    Spouse name: Not on file   Number of children: Not on file   Years of education: Not on file   Highest education level: Not on file  Occupational History   Not on file  Tobacco Use   Smoking status: Never   Smokeless tobacco: Never  Vaping Use   Vaping Use: Never used  Substance and Sexual Activity   Alcohol use: Yes    Alcohol/week: 2.0 standard drinks of  alcohol    Types: 2 Glasses of wine per week   Drug use: No   Sexual activity: Yes  Other Topics Concern   Not on file  Social History Narrative   Not on file   Social Determinants of Health   Financial Resource Strain: Not on file  Food Insecurity: Not on file  Transportation Needs: Not on file  Physical Activity: Not on file  Stress: Not on file  Social Connections: Not on file     Family History: The patient's family history includes Heart disease in her father and mother; Hypertension in her father and mother.  ROS:   Please see the history of present  illness.    Review of Systems  Constitutional:  Negative for chills, fever and malaise/fatigue.  HENT:  Negative for hearing loss and sore throat.   Eyes:  Negative for blurred vision and redness.  Respiratory:  Negative for shortness of breath.   Cardiovascular:  Negative for chest pain, palpitations, orthopnea, claudication, leg swelling and PND.  Gastrointestinal:  Negative for melena, nausea and vomiting.  Genitourinary:  Negative for dysuria and frequency.  Musculoskeletal:  Negative for falls and myalgias.  Neurological:  Negative for dizziness and loss of consciousness.  Endo/Heme/Allergies:  Negative for polydipsia.  Psychiatric/Behavioral:  Negative for substance abuse.     EKGs/Labs/Other Studies Reviewed:    The following studies were reviewed today: TTE 01/2022: IMPRESSIONS     1. Left ventricular ejection fraction, by estimation, is 65 to 70%. Left  ventricular ejection fraction by 3D volume is 71 %. The left ventricle has  normal function. The left ventricle has no regional wall motion  abnormalities. Left ventricular diastolic   function could not be evaluated.   2. Right ventricular systolic function is normal. The right ventricular  size is mildly enlarged. There is mildly elevated pulmonary artery  systolic pressure.   3. Left atrial size was severely dilated.   4. Right atrial size was severely dilated.   5. The mitral valve is normal in structure. No evidence of mitral valve  regurgitation.   6. Tricuspid valve regurgitation is moderate.   7. The aortic valve is tricuspid. There is mild calcification of the  aortic valve. There is mild thickening of the aortic valve. Aortic valve  regurgitation is mild to moderate.   Echo 12/09/17 Diffuse HK worse in the mid and basal inferior wall, EF 50-55, mild to moderate AI, mild MR, moderate LAE, moderate RAE, PASP 47   Echo 03/02/17 EF 55-60, normal wall motion, mild AI, ascending aorta 38 (mildly dilated), mild MR,  severe LAE, mild RAE, mild TR, PASP 32   Echo 01/01/15 EF 55-60, normal wall motion, moderate AI, severe LAE, moderate RAE, PASP 42  EKG:  EKG is  ordered today.  The ekg ordered today demonstrates Afib with HR 69  Recent Labs: No results found for requested labs within last 365 days.  Recent Lipid Panel    Component Value Date/Time   CHOL 121 02/11/2020 0913   TRIG 109 02/11/2020 0913   HDL 59 02/11/2020 0913   CHOLHDL 2.1 02/11/2020 0913   LDLCALC 42 02/11/2020 0913     Risk Assessment/Calculations:    CHA2DS2-VASc Score =    This indicates a  % annual risk of stroke. The patient's score is based upon:     Physical Exam:    VS:  Pulse 69   Ht 5\' 3"  (1.6 m)   Wt 170 lb 9.6 oz (77.4 kg)  SpO2 95%   BMI 30.22 kg/m     Wt Readings from Last 3 Encounters:  02/07/23 170 lb 9.6 oz (77.4 kg)  01/15/22 164 lb (74.4 kg)  01/12/21 158 lb (71.7 kg)     GEN:  Well nourished, well developed in no acute distress HEENT: Normal NECK: No JVD; No carotid bruits CARDIAC: Irregularly irregular, no murmurs, rubs, gallops RESPIRATORY:  Clear to auscultation without rales, wheezing or rhonchi  ABDOMEN: Soft, non-tender, non-distended MUSCULOSKELETAL:  No edema; No deformity  SKIN: Warm and dry NEUROLOGIC:  Alert and oriented x 3 PSYCHIATRIC:  Normal affect   ASSESSMENT:    1. Chronic diastolic (congestive) heart failure   2. Long term current use of anticoagulant therapy   3. Mixed hyperlipidemia   4. Permanent atrial fibrillation   5. Aortic valve insufficiency, etiology of cardiac valve disease unspecified   6. Hyperlipidemia, unspecified hyperlipidemia type   7. Moderate tricuspid insufficiency   8. Primary hypertension    PLAN:    In order of problems listed above:  #Chronic diastolic CHF Had initial acute decompensated episode of volume overload in the setting FluA, RSV with sepsis and pneumonia. Has been stable since that time with no recurrence of symptoms. -No  recurrent symptoms, no diuretics needed.   # Permanent Afib: In persistent a-fib -rate controlled, she is completely asymptomatic, -CHADS-VASc 5, on chronic therapy with Eliquis -Continue diltiazem  daily -Continue metop  XL daily  # Mild to moderate aortic insufficiency by echocardiogram: # Moderate TR -Continue serial monitoring   # Hypertension - well controlled. -Continue dilt and metop as above   # Hyperlipidemia: LDL well controlled at 39. -Continue crestor  daily    Medication Adjustments/Labs and Tests Ordered: Current medicines are reviewed at length with the patient today.  Concerns regarding medicines are outlined above.  Orders Placed This Encounter  Procedures   EKG 12-Lead   No orders of the defined types were placed in this encounter.   Patient Instructions  Medication Instructions:   Your physician recommends that you continue on your current medications as directed. Please refer to the Current Medication list given to you today.  *If you need a refill on your cardiac medications before your next appointment, please call your pharmacy*    Follow-Up: At [facility], you and your health needs are our priority.  As part of our continuing mission to provide you with exceptional heart care, we have created designated Provider Care Teams.  These Care Teams include your primary Cardiologist (physician) and Advanced Practice Providers (APPs -  Physician Assistants and Nurse Practitioners) who all work together to provide you with the care you need, when you need it.  We recommend signing up for the patient portal called "MyChart".  Sign up information is provided on this After Visit Summary.  MyChart is used to connect with patients for Virtual Visits (Telemedicine).  Patients are able to view lab/test results, encounter notes, upcoming appointments, etc.  Non-urgent messages can be sent to your provider as well.   To learn more about what you  can do with MyChart, go to [email or web address].    Your next appointment:   1 year(s)  Provider:   Dr. Shari Prows       Signed, Meriam Sprague, MD  02/07/2023 4:29 PM    [facility]

## 2023-02-07 ENCOUNTER — Encounter: Payer: Self-pay | Admitting: Cardiology

## 2023-02-07 ENCOUNTER — Ambulatory Visit: Payer: Medicare HMO | Attending: Cardiology | Admitting: Cardiology

## 2023-02-07 VITALS — HR 69 | Ht 63.0 in | Wt 170.6 lb

## 2023-02-07 DIAGNOSIS — I071 Rheumatic tricuspid insufficiency: Secondary | ICD-10-CM | POA: Diagnosis not present

## 2023-02-07 DIAGNOSIS — I351 Nonrheumatic aortic (valve) insufficiency: Secondary | ICD-10-CM

## 2023-02-07 DIAGNOSIS — I4821 Permanent atrial fibrillation: Secondary | ICD-10-CM | POA: Diagnosis not present

## 2023-02-07 DIAGNOSIS — I5032 Chronic diastolic (congestive) heart failure: Secondary | ICD-10-CM

## 2023-02-07 DIAGNOSIS — E785 Hyperlipidemia, unspecified: Secondary | ICD-10-CM

## 2023-02-07 DIAGNOSIS — I10 Essential (primary) hypertension: Secondary | ICD-10-CM

## 2023-02-07 DIAGNOSIS — E782 Mixed hyperlipidemia: Secondary | ICD-10-CM

## 2023-02-07 DIAGNOSIS — Z7901 Long term (current) use of anticoagulants: Secondary | ICD-10-CM

## 2023-02-07 NOTE — Patient Instructions (Signed)
Medication Instructions:   Your physician recommends that you continue on your current medications as directed. Please refer to the Current Medication list given to you today.  *If you need a refill on your cardiac medications before your next appointment, please call your pharmacy*    Follow-Up: At [facility], you and your health needs are our priority.  As part of our continuing mission to provide you with exceptional heart care, we have created designated Provider Care Teams.  These Care Teams include your primary Cardiologist (physician) and Advanced Practice Providers (APPs -  Physician Assistants and Nurse Practitioners) who all work together to provide you with the care you need, when you need it.  We recommend signing up for the patient portal called "MyChart".  Sign up information is provided on this After Visit Summary.  MyChart is used to connect with patients for Virtual Visits (Telemedicine).  Patients are able to view lab/test results, encounter notes, upcoming appointments, etc.  Non-urgent messages can be sent to your provider as well.   To learn more about what you can do with MyChart, go to [email or web address].    Your next appointment:   1 year(s)  Provider:   Dr. Pemberton   

## 2023-02-15 DIAGNOSIS — H353112 Nonexudative age-related macular degeneration, right eye, intermediate dry stage: Secondary | ICD-10-CM | POA: Diagnosis not present

## 2023-02-15 DIAGNOSIS — H25813 Combined forms of age-related cataract, bilateral: Secondary | ICD-10-CM | POA: Diagnosis not present

## 2023-02-15 DIAGNOSIS — H353221 Exudative age-related macular degeneration, left eye, with active choroidal neovascularization: Secondary | ICD-10-CM | POA: Diagnosis not present

## 2023-03-08 DIAGNOSIS — H2513 Age-related nuclear cataract, bilateral: Secondary | ICD-10-CM | POA: Diagnosis not present

## 2023-03-08 DIAGNOSIS — H353112 Nonexudative age-related macular degeneration, right eye, intermediate dry stage: Secondary | ICD-10-CM | POA: Diagnosis not present

## 2023-03-08 DIAGNOSIS — H353221 Exudative age-related macular degeneration, left eye, with active choroidal neovascularization: Secondary | ICD-10-CM | POA: Diagnosis not present

## 2023-04-25 ENCOUNTER — Other Ambulatory Visit: Payer: Self-pay

## 2023-04-25 DIAGNOSIS — I4821 Permanent atrial fibrillation: Secondary | ICD-10-CM

## 2023-04-25 DIAGNOSIS — Z7901 Long term (current) use of anticoagulants: Secondary | ICD-10-CM

## 2023-04-25 DIAGNOSIS — E785 Hyperlipidemia, unspecified: Secondary | ICD-10-CM

## 2023-04-25 DIAGNOSIS — I5032 Chronic diastolic (congestive) heart failure: Secondary | ICD-10-CM

## 2023-04-25 DIAGNOSIS — I119 Hypertensive heart disease without heart failure: Secondary | ICD-10-CM

## 2023-04-25 DIAGNOSIS — Z5181 Encounter for therapeutic drug level monitoring: Secondary | ICD-10-CM

## 2023-04-25 DIAGNOSIS — I351 Nonrheumatic aortic (valve) insufficiency: Secondary | ICD-10-CM

## 2023-04-25 MED ORDER — ROSUVASTATIN CALCIUM 10 MG PO TABS: 10.0000 mg | ORAL_TABLET | Freq: Every day | ORAL | 2 refills | Status: AC

## 2023-04-25 MED ORDER — DILTIAZEM HCL ER COATED BEADS 120 MG PO CP24: 120.0000 mg | ORAL_CAPSULE | Freq: Every day | ORAL | 2 refills | Status: DC

## 2023-04-25 MED ORDER — METOPROLOL SUCCINATE ER 50 MG PO TB24: 75.0000 mg | ORAL_TABLET | Freq: Every day | ORAL | 2 refills | Status: AC

## 2023-04-25 NOTE — Addendum Note (Signed)
Addended by: Margaret Pyle D on: 04/25/2023 11:43 AM   Modules accepted: Orders

## 2023-05-19 DIAGNOSIS — N200 Calculus of kidney: Secondary | ICD-10-CM | POA: Diagnosis not present

## 2023-06-07 DIAGNOSIS — H2513 Age-related nuclear cataract, bilateral: Secondary | ICD-10-CM | POA: Diagnosis not present

## 2023-06-07 DIAGNOSIS — H353112 Nonexudative age-related macular degeneration, right eye, intermediate dry stage: Secondary | ICD-10-CM | POA: Diagnosis not present

## 2023-06-07 DIAGNOSIS — H353221 Exudative age-related macular degeneration, left eye, with active choroidal neovascularization: Secondary | ICD-10-CM | POA: Diagnosis not present

## 2023-07-14 DIAGNOSIS — I700 Atherosclerosis of aorta: Secondary | ICD-10-CM | POA: Diagnosis not present

## 2023-07-14 DIAGNOSIS — E669 Obesity, unspecified: Secondary | ICD-10-CM | POA: Diagnosis not present

## 2023-07-14 DIAGNOSIS — I358 Other nonrheumatic aortic valve disorders: Secondary | ICD-10-CM | POA: Diagnosis not present

## 2023-07-14 DIAGNOSIS — I4819 Other persistent atrial fibrillation: Secondary | ICD-10-CM | POA: Diagnosis not present

## 2023-07-14 DIAGNOSIS — Z0000 Encounter for general adult medical examination without abnormal findings: Secondary | ICD-10-CM | POA: Diagnosis not present

## 2023-07-14 DIAGNOSIS — E782 Mixed hyperlipidemia: Secondary | ICD-10-CM | POA: Diagnosis not present

## 2023-07-14 DIAGNOSIS — I110 Hypertensive heart disease with heart failure: Secondary | ICD-10-CM | POA: Diagnosis not present

## 2023-07-14 DIAGNOSIS — R7303 Prediabetes: Secondary | ICD-10-CM | POA: Diagnosis not present

## 2023-07-14 DIAGNOSIS — I10 Essential (primary) hypertension: Secondary | ICD-10-CM | POA: Diagnosis not present

## 2023-07-14 DIAGNOSIS — Z23 Encounter for immunization: Secondary | ICD-10-CM | POA: Diagnosis not present

## 2023-07-14 DIAGNOSIS — D6869 Other thrombophilia: Secondary | ICD-10-CM | POA: Diagnosis not present

## 2023-07-19 DIAGNOSIS — H353221 Exudative age-related macular degeneration, left eye, with active choroidal neovascularization: Secondary | ICD-10-CM | POA: Diagnosis not present

## 2023-07-19 DIAGNOSIS — H353112 Nonexudative age-related macular degeneration, right eye, intermediate dry stage: Secondary | ICD-10-CM | POA: Diagnosis not present

## 2023-07-19 DIAGNOSIS — H2513 Age-related nuclear cataract, bilateral: Secondary | ICD-10-CM | POA: Diagnosis not present

## 2023-07-26 ENCOUNTER — Other Ambulatory Visit: Payer: Self-pay | Admitting: *Deleted

## 2023-07-26 DIAGNOSIS — I4821 Permanent atrial fibrillation: Secondary | ICD-10-CM

## 2023-07-26 MED ORDER — APIXABAN 5 MG PO TABS: 5.0000 mg | ORAL_TABLET | Freq: Two times a day (BID) | ORAL | 1 refills | Status: AC

## 2023-07-26 NOTE — Telephone Encounter (Signed)
Eliquis 5mg  refill request received. Patient is 82 years old, weight-77.4kg, Crea-0.79 on 07/14/23 via [facility] from [place], [place], and last seen by Dr. Shari Prows on 02/07/23. Dose is appropriate based on dosing criteria. Will send in refill to requested pharmacy.    Pt does not have an upcoming appt & recall is set for 1 year, send rx under [facility] today.

## 2023-07-27 DIAGNOSIS — I10 Essential (primary) hypertension: Secondary | ICD-10-CM | POA: Diagnosis not present

## 2023-07-27 DIAGNOSIS — I4811 Longstanding persistent atrial fibrillation: Secondary | ICD-10-CM | POA: Diagnosis not present

## 2023-07-27 DIAGNOSIS — I351 Nonrheumatic aortic (valve) insufficiency: Secondary | ICD-10-CM | POA: Diagnosis not present

## 2023-07-28 DIAGNOSIS — I499 Cardiac arrhythmia, unspecified: Secondary | ICD-10-CM | POA: Diagnosis not present

## 2023-07-28 DIAGNOSIS — I4891 Unspecified atrial fibrillation: Secondary | ICD-10-CM | POA: Diagnosis not present

## 2023-08-16 DIAGNOSIS — H268 Other specified cataract: Secondary | ICD-10-CM | POA: Diagnosis not present

## 2023-08-16 DIAGNOSIS — Z9889 Other specified postprocedural states: Secondary | ICD-10-CM | POA: Diagnosis not present

## 2023-08-16 DIAGNOSIS — H25813 Combined forms of age-related cataract, bilateral: Secondary | ICD-10-CM | POA: Diagnosis not present

## 2023-08-16 DIAGNOSIS — H353112 Nonexudative age-related macular degeneration, right eye, intermediate dry stage: Secondary | ICD-10-CM | POA: Diagnosis not present

## 2023-08-16 DIAGNOSIS — H353222 Exudative age-related macular degeneration, left eye, with inactive choroidal neovascularization: Secondary | ICD-10-CM | POA: Diagnosis not present

## 2023-09-30 DIAGNOSIS — H25813 Combined forms of age-related cataract, bilateral: Secondary | ICD-10-CM | POA: Diagnosis not present

## 2023-10-04 DIAGNOSIS — H353112 Nonexudative age-related macular degeneration, right eye, intermediate dry stage: Secondary | ICD-10-CM | POA: Diagnosis not present

## 2023-10-04 DIAGNOSIS — H353222 Exudative age-related macular degeneration, left eye, with inactive choroidal neovascularization: Secondary | ICD-10-CM | POA: Diagnosis not present

## 2023-10-04 DIAGNOSIS — H2513 Age-related nuclear cataract, bilateral: Secondary | ICD-10-CM | POA: Diagnosis not present

## 2023-10-10 DIAGNOSIS — H25811 Combined forms of age-related cataract, right eye: Secondary | ICD-10-CM | POA: Diagnosis not present

## 2023-10-11 DIAGNOSIS — Z961 Presence of intraocular lens: Secondary | ICD-10-CM | POA: Diagnosis not present

## 2023-10-14 ENCOUNTER — Ambulatory Visit: Payer: Medicare HMO | Admitting: Podiatry

## 2023-10-14 ENCOUNTER — Encounter: Payer: Self-pay | Admitting: Podiatry

## 2023-10-14 DIAGNOSIS — M779 Enthesopathy, unspecified: Secondary | ICD-10-CM | POA: Diagnosis not present

## 2023-10-14 MED ORDER — TRIAMCINOLONE ACETONIDE 10 MG/ML IJ SUSP
10.0000 mg | Freq: Once | INTRAMUSCULAR | Status: AC
Start: 2023-10-14 — End: 2023-10-14
  Administered 2023-10-14: 10 mg via INTRA_ARTICULAR

## 2023-10-14 NOTE — Progress Notes (Signed)
Subjective:   Patient ID: Kathryn Chang, female   DOB: 82 y.o.   MRN: [id number]   HPI Patient states that she is getting pain on top of both feet again and did very well for almost a year   ROS      Objective:  Physical Exam  Neurovascular status intact inflammation of the dorsal tendon group bilateral     Assessment:  Extensor tendinitis bilateral painful     Plan:  Sterile prep injected the extensor complex 3 mg dexamethasone Kenalog 5 mg Xylocaine tolerated well

## 2023-10-18 DIAGNOSIS — Z961 Presence of intraocular lens: Secondary | ICD-10-CM | POA: Diagnosis not present

## 2023-10-24 DIAGNOSIS — Z7901 Long term (current) use of anticoagulants: Secondary | ICD-10-CM | POA: Diagnosis not present

## 2023-10-24 DIAGNOSIS — I351 Nonrheumatic aortic (valve) insufficiency: Secondary | ICD-10-CM | POA: Diagnosis not present

## 2023-10-24 DIAGNOSIS — I10 Essential (primary) hypertension: Secondary | ICD-10-CM | POA: Diagnosis not present

## 2023-10-24 DIAGNOSIS — H25812 Combined forms of age-related cataract, left eye: Secondary | ICD-10-CM | POA: Diagnosis not present

## 2023-10-24 DIAGNOSIS — I4811 Longstanding persistent atrial fibrillation: Secondary | ICD-10-CM | POA: Diagnosis not present

## 2023-10-24 DIAGNOSIS — Z79899 Other long term (current) drug therapy: Secondary | ICD-10-CM | POA: Diagnosis not present

## 2023-10-24 DIAGNOSIS — I482 Chronic atrial fibrillation, unspecified: Secondary | ICD-10-CM | POA: Diagnosis not present

## 2023-10-24 DIAGNOSIS — I700 Atherosclerosis of aorta: Secondary | ICD-10-CM | POA: Diagnosis not present

## 2023-10-25 DIAGNOSIS — Z961 Presence of intraocular lens: Secondary | ICD-10-CM | POA: Diagnosis not present

## 2023-10-31 DIAGNOSIS — I083 Combined rheumatic disorders of mitral, aortic and tricuspid valves: Secondary | ICD-10-CM | POA: Diagnosis not present

## 2023-10-31 DIAGNOSIS — I351 Nonrheumatic aortic (valve) insufficiency: Secondary | ICD-10-CM | POA: Diagnosis not present

## 2023-10-31 DIAGNOSIS — I272 Pulmonary hypertension, unspecified: Secondary | ICD-10-CM | POA: Diagnosis not present

## 2023-10-31 DIAGNOSIS — I4811 Longstanding persistent atrial fibrillation: Secondary | ICD-10-CM | POA: Diagnosis not present

## 2023-10-31 DIAGNOSIS — I10 Essential (primary) hypertension: Secondary | ICD-10-CM | POA: Diagnosis not present

## 2023-11-09 DIAGNOSIS — I4891 Unspecified atrial fibrillation: Secondary | ICD-10-CM | POA: Diagnosis not present

## 2023-11-10 DIAGNOSIS — Z1231 Encounter for screening mammogram for malignant neoplasm of breast: Secondary | ICD-10-CM | POA: Diagnosis not present

## 2023-11-12 DIAGNOSIS — I4891 Unspecified atrial fibrillation: Secondary | ICD-10-CM | POA: Diagnosis not present

## 2023-11-25 DIAGNOSIS — I499 Cardiac arrhythmia, unspecified: Secondary | ICD-10-CM | POA: Diagnosis not present

## 2023-11-25 DIAGNOSIS — I493 Ventricular premature depolarization: Secondary | ICD-10-CM | POA: Diagnosis not present

## 2023-11-25 DIAGNOSIS — I4891 Unspecified atrial fibrillation: Secondary | ICD-10-CM | POA: Diagnosis not present

## 2023-11-29 DIAGNOSIS — Z961 Presence of intraocular lens: Secondary | ICD-10-CM | POA: Diagnosis not present

## 2023-12-01 DIAGNOSIS — Z6831 Body mass index (BMI) 31.0-31.9, adult: Secondary | ICD-10-CM | POA: Diagnosis not present

## 2023-12-01 DIAGNOSIS — R051 Acute cough: Secondary | ICD-10-CM | POA: Diagnosis not present

## 2023-12-01 DIAGNOSIS — Z03818 Encounter for observation for suspected exposure to other biological agents ruled out: Secondary | ICD-10-CM | POA: Diagnosis not present

## 2023-12-01 DIAGNOSIS — U071 COVID-19: Secondary | ICD-10-CM | POA: Diagnosis not present

## 2023-12-20 DIAGNOSIS — H353222 Exudative age-related macular degeneration, left eye, with inactive choroidal neovascularization: Secondary | ICD-10-CM | POA: Diagnosis not present

## 2023-12-20 DIAGNOSIS — Z961 Presence of intraocular lens: Secondary | ICD-10-CM | POA: Diagnosis not present

## 2023-12-20 DIAGNOSIS — H353112 Nonexudative age-related macular degeneration, right eye, intermediate dry stage: Secondary | ICD-10-CM | POA: Diagnosis not present

## 2024-01-16 DIAGNOSIS — I493 Ventricular premature depolarization: Secondary | ICD-10-CM | POA: Diagnosis not present

## 2024-01-16 DIAGNOSIS — I4811 Longstanding persistent atrial fibrillation: Secondary | ICD-10-CM | POA: Diagnosis not present

## 2024-01-16 DIAGNOSIS — I272 Pulmonary hypertension, unspecified: Secondary | ICD-10-CM | POA: Diagnosis not present

## 2024-01-16 DIAGNOSIS — I4891 Unspecified atrial fibrillation: Secondary | ICD-10-CM | POA: Diagnosis not present

## 2024-01-16 DIAGNOSIS — I10 Essential (primary) hypertension: Secondary | ICD-10-CM | POA: Diagnosis not present

## 2024-01-17 DIAGNOSIS — Z6831 Body mass index (BMI) 31.0-31.9, adult: Secondary | ICD-10-CM | POA: Diagnosis not present

## 2024-01-17 DIAGNOSIS — I10 Essential (primary) hypertension: Secondary | ICD-10-CM | POA: Diagnosis not present

## 2024-01-17 DIAGNOSIS — E782 Mixed hyperlipidemia: Secondary | ICD-10-CM | POA: Diagnosis not present

## 2024-01-17 DIAGNOSIS — I5032 Chronic diastolic (congestive) heart failure: Secondary | ICD-10-CM | POA: Diagnosis not present

## 2024-01-17 DIAGNOSIS — Z872 Personal history of diseases of the skin and subcutaneous tissue: Secondary | ICD-10-CM | POA: Diagnosis not present

## 2024-01-17 DIAGNOSIS — R7303 Prediabetes: Secondary | ICD-10-CM | POA: Diagnosis not present

## 2024-01-17 DIAGNOSIS — I4819 Other persistent atrial fibrillation: Secondary | ICD-10-CM | POA: Diagnosis not present

## 2024-01-17 DIAGNOSIS — D6869 Other thrombophilia: Secondary | ICD-10-CM | POA: Diagnosis not present

## 2024-01-31 DIAGNOSIS — H353112 Nonexudative age-related macular degeneration, right eye, intermediate dry stage: Secondary | ICD-10-CM | POA: Diagnosis not present

## 2024-01-31 DIAGNOSIS — I493 Ventricular premature depolarization: Secondary | ICD-10-CM | POA: Diagnosis not present

## 2024-01-31 DIAGNOSIS — H353222 Exudative age-related macular degeneration, left eye, with inactive choroidal neovascularization: Secondary | ICD-10-CM | POA: Diagnosis not present

## 2024-02-01 DIAGNOSIS — I4891 Unspecified atrial fibrillation: Secondary | ICD-10-CM | POA: Diagnosis not present

## 2024-02-01 DIAGNOSIS — I493 Ventricular premature depolarization: Secondary | ICD-10-CM | POA: Diagnosis not present

## 2024-02-09 ENCOUNTER — Other Ambulatory Visit: Payer: Self-pay

## 2024-02-09 MED ORDER — DILTIAZEM HCL ER COATED BEADS 120 MG PO CP24: 120.0000 mg | ORAL_CAPSULE | Freq: Every day | ORAL | 0 refills | Status: AC

## 2024-03-20 DIAGNOSIS — Z961 Presence of intraocular lens: Secondary | ICD-10-CM | POA: Diagnosis not present

## 2024-03-20 DIAGNOSIS — H353222 Exudative age-related macular degeneration, left eye, with inactive choroidal neovascularization: Secondary | ICD-10-CM | POA: Diagnosis not present

## 2024-03-20 DIAGNOSIS — H353112 Nonexudative age-related macular degeneration, right eye, intermediate dry stage: Secondary | ICD-10-CM | POA: Diagnosis not present

## 2024-03-27 DIAGNOSIS — Z6830 Body mass index (BMI) 30.0-30.9, adult: Secondary | ICD-10-CM | POA: Diagnosis not present

## 2024-03-27 DIAGNOSIS — E782 Mixed hyperlipidemia: Secondary | ICD-10-CM | POA: Diagnosis not present

## 2024-03-27 DIAGNOSIS — E669 Obesity, unspecified: Secondary | ICD-10-CM | POA: Diagnosis not present

## 2024-03-27 DIAGNOSIS — Z713 Dietary counseling and surveillance: Secondary | ICD-10-CM | POA: Diagnosis not present

## 2024-03-27 DIAGNOSIS — I10 Essential (primary) hypertension: Secondary | ICD-10-CM | POA: Diagnosis not present

## 2024-03-27 DIAGNOSIS — I5032 Chronic diastolic (congestive) heart failure: Secondary | ICD-10-CM | POA: Diagnosis not present

## 2024-03-27 DIAGNOSIS — R7303 Prediabetes: Secondary | ICD-10-CM | POA: Diagnosis not present

## 2024-03-28 DIAGNOSIS — L814 Other melanin hyperpigmentation: Secondary | ICD-10-CM | POA: Diagnosis not present

## 2024-03-28 DIAGNOSIS — D2371 Other benign neoplasm of skin of right lower limb, including hip: Secondary | ICD-10-CM | POA: Diagnosis not present

## 2024-03-28 DIAGNOSIS — D235 Other benign neoplasm of skin of trunk: Secondary | ICD-10-CM | POA: Diagnosis not present

## 2024-03-28 DIAGNOSIS — L579 Skin changes due to chronic exposure to nonionizing radiation, unspecified: Secondary | ICD-10-CM | POA: Diagnosis not present

## 2024-03-28 DIAGNOSIS — L821 Other seborrheic keratosis: Secondary | ICD-10-CM | POA: Diagnosis not present

## 2024-03-28 DIAGNOSIS — D225 Melanocytic nevi of trunk: Secondary | ICD-10-CM | POA: Diagnosis not present

## 2024-03-29 ENCOUNTER — Other Ambulatory Visit: Payer: Self-pay | Admitting: Cardiology

## 2024-03-29 DIAGNOSIS — I4821 Permanent atrial fibrillation: Secondary | ICD-10-CM

## 2024-05-15 DIAGNOSIS — E669 Obesity, unspecified: Secondary | ICD-10-CM | POA: Diagnosis not present

## 2024-05-15 DIAGNOSIS — Z6830 Body mass index (BMI) 30.0-30.9, adult: Secondary | ICD-10-CM | POA: Diagnosis not present

## 2024-05-15 DIAGNOSIS — Z713 Dietary counseling and surveillance: Secondary | ICD-10-CM | POA: Diagnosis not present

## 2024-05-15 DIAGNOSIS — I10 Essential (primary) hypertension: Secondary | ICD-10-CM | POA: Diagnosis not present

## 2024-05-15 DIAGNOSIS — E782 Mixed hyperlipidemia: Secondary | ICD-10-CM | POA: Diagnosis not present

## 2024-05-15 DIAGNOSIS — H353222 Exudative age-related macular degeneration, left eye, with inactive choroidal neovascularization: Secondary | ICD-10-CM | POA: Diagnosis not present

## 2024-05-15 DIAGNOSIS — I5032 Chronic diastolic (congestive) heart failure: Secondary | ICD-10-CM | POA: Diagnosis not present

## 2024-05-15 DIAGNOSIS — R7303 Prediabetes: Secondary | ICD-10-CM | POA: Diagnosis not present

## 2024-05-24 DIAGNOSIS — N200 Calculus of kidney: Secondary | ICD-10-CM | POA: Diagnosis not present

## 2024-06-13 DIAGNOSIS — L249 Irritant contact dermatitis, unspecified cause: Secondary | ICD-10-CM | POA: Diagnosis not present

## 2024-07-02 ENCOUNTER — Ambulatory Visit (INDEPENDENT_AMBULATORY_CARE_PROVIDER_SITE_OTHER)

## 2024-07-02 ENCOUNTER — Ambulatory Visit: Admitting: Podiatry

## 2024-07-02 DIAGNOSIS — M19072 Primary osteoarthritis, left ankle and foot: Secondary | ICD-10-CM

## 2024-07-02 DIAGNOSIS — M779 Enthesopathy, unspecified: Secondary | ICD-10-CM

## 2024-07-02 DIAGNOSIS — M21962 Unspecified acquired deformity of left lower leg: Secondary | ICD-10-CM

## 2024-07-04 NOTE — Progress Notes (Signed)
 Subjective:   Patient ID: Kathryn Chang, female   DOB: 83 y.o.   MRN: [id number]   HPI Patient presents stating she has the same inflammation and pain of her midfoot of both feet.  States that injections help her for her.  To time but reoccurrence is consistent   ROS      Objective:  Physical Exam  Neurovascular status found to be intact continued swelling and midfoot pain bilateral with the patient evaluated by Dr. Prentice Ovens for the possibility of deep peroneal nerve resection     Assessment:  Chronic pain in the midfoot which may be able to be fixed with nerve resection     Plan:  H&P this was discussed with the patient and at this point a nerve injection was administered to the deep peroneal nerve and if this is found to be effective the possibility for deep peroneal nerve resection can be undertaken versus doing conventional injection treatment.  Patient to be seen back to see results of this deep peroneal injection

## 2024-07-17 DIAGNOSIS — Z6829 Body mass index (BMI) 29.0-29.9, adult: Secondary | ICD-10-CM | POA: Diagnosis not present

## 2024-07-17 DIAGNOSIS — H353112 Nonexudative age-related macular degeneration, right eye, intermediate dry stage: Secondary | ICD-10-CM | POA: Diagnosis not present

## 2024-07-17 DIAGNOSIS — M810 Age-related osteoporosis without current pathological fracture: Secondary | ICD-10-CM | POA: Diagnosis not present

## 2024-07-17 DIAGNOSIS — Z23 Encounter for immunization: Secondary | ICD-10-CM | POA: Diagnosis not present

## 2024-07-17 DIAGNOSIS — I4819 Other persistent atrial fibrillation: Secondary | ICD-10-CM | POA: Diagnosis not present

## 2024-07-17 DIAGNOSIS — Z1331 Encounter for screening for depression: Secondary | ICD-10-CM | POA: Diagnosis not present

## 2024-07-17 DIAGNOSIS — E782 Mixed hyperlipidemia: Secondary | ICD-10-CM | POA: Diagnosis not present

## 2024-07-17 DIAGNOSIS — I5032 Chronic diastolic (congestive) heart failure: Secondary | ICD-10-CM | POA: Diagnosis not present

## 2024-07-17 DIAGNOSIS — Z0000 Encounter for general adult medical examination without abnormal findings: Secondary | ICD-10-CM | POA: Diagnosis not present

## 2024-07-17 DIAGNOSIS — I10 Essential (primary) hypertension: Secondary | ICD-10-CM | POA: Diagnosis not present

## 2024-07-17 DIAGNOSIS — H353222 Exudative age-related macular degeneration, left eye, with inactive choroidal neovascularization: Secondary | ICD-10-CM | POA: Diagnosis not present

## 2024-07-19 DIAGNOSIS — M8589 Other specified disorders of bone density and structure, multiple sites: Secondary | ICD-10-CM | POA: Diagnosis not present

## 2024-07-26 DIAGNOSIS — I493 Ventricular premature depolarization: Secondary | ICD-10-CM | POA: Diagnosis not present

## 2024-07-26 DIAGNOSIS — I4811 Longstanding persistent atrial fibrillation: Secondary | ICD-10-CM | POA: Diagnosis not present

## 2024-07-26 DIAGNOSIS — I10 Essential (primary) hypertension: Secondary | ICD-10-CM | POA: Diagnosis not present

## 2024-07-26 DIAGNOSIS — I272 Pulmonary hypertension, unspecified: Secondary | ICD-10-CM | POA: Diagnosis not present

## 2024-09-18 DIAGNOSIS — H353222 Exudative age-related macular degeneration, left eye, with inactive choroidal neovascularization: Secondary | ICD-10-CM | POA: Diagnosis not present

## 2024-11-16 ENCOUNTER — Ambulatory Visit: Admitting: Podiatry

## 2024-11-16 DIAGNOSIS — M779 Enthesopathy, unspecified: Secondary | ICD-10-CM | POA: Diagnosis not present

## 2024-11-16 MED ORDER — TRIAMCINOLONE ACETONIDE 10 MG/ML IJ SUSP
10.0000 mg | Freq: Once | INTRAMUSCULAR | Status: AC
  Administered 2024-11-16: 10 mg via INTRA_ARTICULAR

## 2024-11-21 NOTE — Progress Notes (Signed)
 Subjective:   Patient ID: Kathryn Chang, female   DOB: 84 y.o.   MRN: [id number]   HPI Patient presents stating both midfoot are hurting left over right both bothering her and making it hard to wear shoe gear comfortably   ROS      Objective:  Physical Exam  Neuro vascular status intact with tendinitis of the dorsal foot bilateral with inflammation fluid buildup     Assessment:  Extensor tendinitis bilateral with inflammation     Plan:  H&P reviewed risk of injections patient wants to pursue this course that she has done well with that and I did sterile prep injected the dorsal tendon complex bilateral 3 mg dexamethasone  Kenalog  5 mg Xylocaine.  I did discuss midfoot arthritis possibility for other treatments in the future we will see how she responds to this
